# Patient Record
Sex: Female | Born: 2000
Health system: Southern US, Community
[De-identification: ages and names within clinical notes are randomized; demographics above are authoritative.]

## PROBLEM LIST (undated history)

## (undated) DIAGNOSIS — F909 Attention-deficit hyperactivity disorder, unspecified type: Secondary | ICD-10-CM

## (undated) DIAGNOSIS — G43009 Migraine without aura, not intractable, without status migrainosus: Secondary | ICD-10-CM

## (undated) HISTORY — DX: Migraine without aura, not intractable, without status migrainosus: G43.009

## (undated) HISTORY — DX: Attention-deficit hyperactivity disorder, unspecified type: F90.9

## (undated) HISTORY — PX: WISDOM TOOTH EXTRACTION: SHX21

## (undated) HISTORY — PX: MULTIPLE TOOTH EXTRACTIONS: SHX2053

---
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
## 2020-01-16 ENCOUNTER — Other Ambulatory Visit: Payer: Self-pay

## 2020-01-16 MED ORDER — NORETHIN ACE-ETH ESTRAD-FE 1-20 MG-MCG PO TABS: 1.0000 | ORAL_TABLET | Freq: Every day | ORAL | 0 refills | Status: DC

## 2020-01-16 NOTE — Telephone Encounter (Signed)
Script sent, please have her schedule an annual exam.

## 2020-01-16 NOTE — Telephone Encounter (Signed)
AEX scheduled for 02/26/2020 with JJ.

## 2020-01-16 NOTE — Telephone Encounter (Signed)
Medication refill request: OCP Last AEX: 02/21/2019 Next AEX: Not scheduled Last MMG (if hormonal medication request): n/a Refill authorized: 3 Package, 0 RF pended.   Please advise and refill if appropriate.

## 2020-01-20 ENCOUNTER — Ambulatory Visit: Payer: 59 | Attending: Internal Medicine

## 2020-01-20 DIAGNOSIS — Z23 Encounter for immunization: Secondary | ICD-10-CM

## 2020-01-20 NOTE — Progress Notes (Signed)
   Covid-19 Vaccination Clinic  Name:  Acquanetta Cabanilla    MRN: [id number] DOB: [birth year]  01/20/2020  Ms. Kaufhold was observed post Covid-19 immunization for 15 minutes without incident. She was provided with Vaccine Information Sheet and instruction to access the V-Safe system.   Ms. Cravey was instructed to call 911 with any severe reactions post vaccine: Marland Kitchen Difficulty breathing  . Swelling of face and throat  . A fast heartbeat  . A bad rash all over body  . Dizziness and weakness   Immunizations Administered    Name Date Dose VIS Date Route   Pfizer COVID-19 Vaccine 01/20/2020  4:05 PM 0.3 mL 11/19/2018 Intramuscular   Manufacturer: [employer], [employer]   Lot: [id number]   NDC: [address]-3

## 2020-02-24 NOTE — Progress Notes (Signed)
19 y.o. G0P0000 Single White or Caucasian Not Hispanic or Latino female here for annual exam.  Not sexually active since last year.  Period Cycle (Days): 28 Period Duration (Days): 7 Period Pattern: Regular Menstrual Flow: Heavy, Moderate Menstrual Control: Tampon Menstrual Control Change Freq (Hours): 1 Dysmenorrhea: (!) Moderate Dysmenorrhea Symptoms: Cramping, Headache(back pain)  Patient's last menstrual period was 02/13/2020 (approximate).          Sexually active: No.  The current method of family planning is OCP (estrogen/progesterone).    Exercising: Yes.    walking and occasional running Smoker:  Yes, e-cigarettes, trying to quit. Hasn't vaped in a week.   Health Maintenance: Pap:  N/A History of abnormal Pap:  N/A TDaP:  01/08/12 Gardasil: Complete    reports that she has been smoking e-cigarettes. She has never used smokeless tobacco. She reports that she does not drink alcohol or use drugs. Adopted by her PGM. 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   Past Medical History:  Diagnosis Date  . ADHD   . Migraine without aura     Past Surgical History:  Procedure Laterality Date  . MULTIPLE TOOTH EXTRACTIONS    . WISDOM TOOTH EXTRACTION      Current Outpatient Medications  Medication Sig Dispense Refill  . amitriptyline (ELAVIL) 25 MG tablet Take 1 tablet (25 mg total) by mouth at bedtime. 90 tablet 2  . Aspirin-Acetaminophen-Caffeine (PAMPRIN MAX PO) Take by mouth.    . Cetirizine HCl (ZYRTEC PO) Take by mouth.    . CONCERTA 54 MG CR tablet Take 1 tablet by mouth daily.    Marland Kitchen ibuprofen (ADVIL) 800 MG tablet Take 800 mg by mouth as needed.    . norethindrone-ethinyl estradiol (LOESTRIN FE) 1-20 MG-MCG tablet Take 1 tablet by mouth daily. 3 Package 0  . Vitamin D, Ergocalciferol, (DRISDOL) 50000 units CAPS capsule Take 50,000 Units by mouth every 7 (seven) days.    . Calcium-Magnesium-Vitamin D  (CALCIUM 500 PO) Take by mouth.    . Magnesium Oxide 500 MG TABS Take 1 tablet (500 mg total) by mouth daily. (Patient not taking: Reported on 05/21/2019)  0  . Multiple Vitamin (MULTIVITAMIN) capsule Take 1 capsule by mouth daily.     No current facility-administered medications for this visit.    Family History  Adopted: Yes  Problem Relation Age of Onset  . Breast cancer Maternal Aunt   . Breast cancer Maternal Grandmother   . Thyroid disease Paternal Grandmother   . Uterine cancer Paternal Grandmother   . Migraines Neg Hx   . Seizures Neg Hx   MGM was 27 with diagnosis, things her aunt was also in her 52's.   Review of Systems  Constitutional: Negative.   HENT: Negative.   Eyes: Negative.   Respiratory: Negative.   Cardiovascular: Negative.   Gastrointestinal: Negative.   Endocrine: Negative.   Genitourinary: Negative.   Musculoskeletal: Negative.   Skin: Negative.   Allergic/Immunologic: Negative.   Neurological: Negative.   Hematological: Negative.   Psychiatric/Behavioral: Negative.     Exam:   BP (!) 112/58 (BP Location: Right Arm, Patient Position: Sitting, Cuff Size: Normal)   Pulse (!) 110   Temp 98.7 F (37.1 C) (Temporal)   Ht 5' 1.75" (1.568 m)   Wt 99 lb 3.2 oz (45 kg)   LMP 02/13/2020 (Approximate)   SpO2 98%   BMI 18.29 kg/m   Weight change: @  WEIGHTCHANGE@ Height:   Height: 5' 1.75" (156.8 cm)  Ht Readings from Last 3 Encounters:  02/26/20 5' 1.75" (1.568 m) (16 %, Z= -0.99)*  02/21/19 5\' 2"  (1.575 m) (19 %, Z= -0.87)*  07/18/18 5' 1.5" (1.562 m) (15 %, Z= -1.05)*   * Growth percentiles are based on CDC (Girls, 2-20 Years) data.    General appearance: alert, cooperative and appears stated age Head: Normocephalic, without obvious abnormality, atraumatic Neck: no adenopathy, supple, symmetrical, trachea midline and thyroid normal to inspection and palpation Lungs: clear to auscultation bilaterally Cardiovascular: regular rate and  rhythm Breasts: normal appearance, no masses or tenderness Abdomen: soft, non-tender; non distended,  no masses,  no organomegaly Extremities: extremities normal, atraumatic, no cyanosis or edema Skin: Skin color, texture, turgor normal. No rashes or lesions Lymph nodes: Cervical, supraclavicular, and axillary nodes normal. No abnormal inguinal nodes palpated Neurologic: Grossly normal Pelvic: deferred  A:  Well Woman with normal exam  On OCP's, doing well  Not sexually active in the last year  FH of breast cancer  Vit d def  P:   No pap until 21  Continue OCP's  No STD testing needed  Discussed breast self exam  Discussed calcium and vit D intake  Screening labs, vit d

## 2020-02-25 ENCOUNTER — Other Ambulatory Visit: Payer: Self-pay

## 2020-02-26 ENCOUNTER — Ambulatory Visit: Payer: 59 | Admitting: Obstetrics and Gynecology

## 2020-02-26 ENCOUNTER — Encounter: Payer: Self-pay | Admitting: Obstetrics and Gynecology

## 2020-02-26 VITALS — BP 112/58 | HR 110 | Temp 98.7°F | Ht 61.75 in | Wt 99.2 lb

## 2020-02-26 DIAGNOSIS — E559 Vitamin D deficiency, unspecified: Secondary | ICD-10-CM

## 2020-02-26 DIAGNOSIS — Z3041 Encounter for surveillance of contraceptive pills: Secondary | ICD-10-CM | POA: Diagnosis not present

## 2020-02-26 DIAGNOSIS — Z0000 Encounter for general adult medical examination without abnormal findings: Secondary | ICD-10-CM

## 2020-02-26 DIAGNOSIS — Z01419 Encounter for gynecological examination (general) (routine) without abnormal findings: Secondary | ICD-10-CM | POA: Diagnosis not present

## 2020-02-26 MED ORDER — NORETHIN ACE-ETH ESTRAD-FE 1-20 MG-MCG PO TABS: 1.0000 | ORAL_TABLET | Freq: Every day | ORAL | 4 refills | Status: DC

## 2020-02-26 NOTE — Patient Instructions (Signed)
EXERCISE AND DIET:  We recommended that you start or continue a regular exercise program for good health. Regular exercise means any activity that makes your heart beat faster and makes you sweat.  We recommend exercising at least 30 minutes per day at least 3 days a week, preferably 4 or 5.  We also recommend a diet low in fat and sugar.  Inactivity, poor dietary choices and obesity can cause diabetes, heart attack, stroke, and kidney damage, among others.    ALCOHOL AND SMOKING:  Women should limit their alcohol intake to no more than 7 drinks/beers/glasses of wine (combined, not each!) per week. Moderation of alcohol intake to this level decreases your risk of breast cancer and liver damage. And of course, no recreational drugs are part of a healthy lifestyle.  And absolutely no smoking or even second hand smoke. Most people know smoking can cause heart and lung diseases, but did you know it also contributes to weakening of your bones? Aging of your skin?  Yellowing of your teeth and nails?  CALCIUM AND VITAMIN D:  Adequate intake of calcium and Vitamin D are recommended.  The recommendations for exact amounts of these supplements seem to change often, but generally speaking 1,000 mg of calcium (between diet and supplement) and 800 units of Vitamin D per day seems prudent. Certain women may benefit from higher intake of Vitamin D.  If you are among these women, your doctor will have told you during your visit.    PAP SMEARS:  Pap smears, to check for cervical cancer or precancers,  have traditionally been done yearly, although recent scientific advances have shown that most women can have pap smears less often.  However, every woman still should have a physical exam from her gynecologist every year. It will include a breast check, inspection of the vulva and vagina to check for abnormal growths or skin changes, a visual exam of the cervix, and then an exam to evaluate the size and shape of the uterus and  ovaries.  And after 19 years of age, a rectal exam is indicated to check for rectal cancers. We will also provide age appropriate advice regarding health maintenance, like when you should have certain vaccines, screening for sexually transmitted diseases, bone density testing, colonoscopy, mammograms, etc.   MAMMOGRAMS:  All women over 40 years old should have a yearly mammogram. Many facilities now offer a "3D" mammogram, which may cost around $50 extra out of pocket. If possible,  we recommend you accept the option to have the 3D mammogram performed.  It both reduces the number of women who will be called back for extra views which then turn out to be normal, and it is better than the routine mammogram at detecting truly abnormal areas.    COLON CANCER SCREENING: Now recommend starting at age 45. At this time colonoscopy is not covered for routine screening until 50. There are take home tests that can be done between 45-49.   COLONOSCOPY:  Colonoscopy to screen for colon cancer is recommended for all women at age 50.  We know, you hate the idea of the prep.  We agree, BUT, having colon cancer and not knowing it is worse!!  Colon cancer so often starts as a polyp that can be seen and removed at colonscopy, which can quite literally save your life!  And if your first colonoscopy is normal and you have no family history of colon cancer, most women don't have to have it again for   10 years.  Once every ten years, you can do something that may end up saving your life, right?  We will be happy to help you get it scheduled when you are ready.  Be sure to check your insurance coverage so you understand how much it will cost.  It may be covered as a preventative service at no cost, but you should check your particular policy.      Breast Self-Awareness Breast self-awareness means being familiar with how your breasts look and feel. It involves checking your breasts regularly and reporting any changes to your  health care provider. Practicing breast self-awareness is important. A change in your breasts can be a sign of a serious medical problem. Being familiar with how your breasts look and feel allows you to find any problems early, when treatment is more likely to be successful. All women should practice breast self-awareness, including women who have had breast implants. How to do a breast self-exam One way to learn what is normal for your breasts and whether your breasts are changing is to do a breast self-exam. To do a breast self-exam: Look for Changes  1. Remove all the clothing above your waist. 2. Stand in front of a mirror in a room with good lighting. 3. Put your hands on your hips. 4. Push your hands firmly downward. 5. Compare your breasts in the mirror. Look for differences between them (asymmetry), such as: ? Differences in shape. ? Differences in size. ? Puckers, dips, and bumps in one breast and not the other. 6. Look at each breast for changes in your skin, such as: ? Redness. ? Scaly areas. 7. Look for changes in your nipples, such as: ? Discharge. ? Bleeding. ? Dimpling. ? Redness. ? A change in position. Feel for Changes Carefully feel your breasts for lumps and changes. It is best to do this while lying on your back on the floor and again while sitting or standing in the shower or tub with soapy water on your skin. Feel each breast in the following way:  Place the arm on the side of the breast you are examining above your head.  Feel your breast with the other hand.  Start in the nipple area and make  inch (2 cm) overlapping circles to feel your breast. Use the pads of your three middle fingers to do this. Apply light pressure, then medium pressure, then firm pressure. The light pressure will allow you to feel the tissue closest to the skin. The medium pressure will allow you to feel the tissue that is a little deeper. The firm pressure will allow you to feel the tissue  close to the ribs.  Continue the overlapping circles, moving downward over the breast until you feel your ribs below your breast.  Move one finger-width toward the center of the body. Continue to use the  inch (2 cm) overlapping circles to feel your breast as you move slowly up toward your collarbone.  Continue the up and down exam using all three pressures until you reach your armpit.  Write Down What You Find  Write down what is normal for each breast and any changes that you find. Keep a written record with breast changes or normal findings for each breast. By writing this information down, you do not need to depend only on memory for size, tenderness, or location. Write down where you are in your menstrual cycle, if you are still menstruating. If you are having trouble noticing differences   in your breasts, do not get discouraged. With time you will become more familiar with the variations in your breasts and more comfortable with the exam. How often should I examine my breasts? Examine your breasts every month. If you are breastfeeding, the best time to examine your breasts is after a feeding or after using a breast pump. If you menstruate, the best time to examine your breasts is 5-7 days after your period is over. During your period, your breasts are lumpier, and it may be more difficult to notice changes. When should I see my health care provider? See your health care provider if you notice:  A change in shape or size of your breasts or nipples.  A change in the skin of your breast or nipples, such as a reddened or scaly area.  Unusual discharge from your nipples.  A lump or thick area that was not there before.  Pain in your breasts.  Anything that concerns you.  

## 2020-02-27 LAB — COMPREHENSIVE METABOLIC PANEL
ALT: 11 IU/L (ref 0–32)
AST: 14 IU/L (ref 0–40)
Albumin/Globulin Ratio: 2 (ref 1.2–2.2)
Albumin: 4.5 g/dL (ref 3.9–5.0)
Alkaline Phosphatase: 38 IU/L — ABNORMAL LOW (ref 45–106)
BUN/Creatinine Ratio: 16 (ref 9–23)
BUN: 13 mg/dL (ref 6–20)
Bilirubin Total: 0.3 mg/dL (ref 0.0–1.2)
CO2: 23 mmol/L (ref 20–29)
Calcium: 9.4 mg/dL (ref 8.7–10.2)
Chloride: 102 mmol/L (ref 96–106)
Creatinine, Ser: 0.79 mg/dL (ref 0.57–1.00)
GFR calc Af Amer: 125 mL/min/{1.73_m2} (ref >59)
GFR calc non Af Amer: 109 mL/min/{1.73_m2} (ref >59)
Globulin, Total: 2.3 g/dL (ref 1.5–4.5)
Glucose: 83 mg/dL (ref 65–99)
Potassium: 4.1 mmol/L (ref 3.5–5.2)
Sodium: 137 mmol/L (ref 134–144)
Total Protein: 6.8 g/dL (ref 6.0–8.5)

## 2020-02-27 LAB — VITAMIN D 25 HYDROXY (VIT D DEFICIENCY, FRACTURES): Vit D, 25-Hydroxy: 18.6 ng/mL — ABNORMAL LOW (ref 30.0–100.0)

## 2020-02-27 LAB — CBC
Hematocrit: 41.2 % (ref 34.0–46.6)
Hemoglobin: 13.8 g/dL (ref 11.1–15.9)
MCH: 28.7 pg (ref 26.6–33.0)
MCHC: 33.5 g/dL (ref 31.5–35.7)
MCV: 86 fL (ref 79–97)
Platelets: 225 10*3/uL (ref 150–450)
RBC: 4.81 x10E6/uL (ref 3.77–5.28)
RDW: 12.6 % (ref 11.7–15.4)
WBC: 4.1 10*3/uL (ref 3.4–10.8)

## 2020-02-27 LAB — LIPID PANEL
Chol/HDL Ratio: 3.8 ratio (ref 0.0–4.4)
Cholesterol, Total: 190 mg/dL — ABNORMAL HIGH (ref 100–169)
HDL: 50 mg/dL (ref >39)
LDL Chol Calc (NIH): 121 mg/dL — ABNORMAL HIGH (ref 0–109)
Triglycerides: 106 mg/dL — ABNORMAL HIGH (ref 0–89)
VLDL Cholesterol Cal: 19 mg/dL (ref 5–40)

## 2020-10-15 ENCOUNTER — Telehealth (INDEPENDENT_AMBULATORY_CARE_PROVIDER_SITE_OTHER): Payer: Self-pay | Admitting: Neurology

## 2020-10-15 NOTE — Telephone Encounter (Signed)
LVM for patient to return call to schedule follow up.

## 2021-01-27 ENCOUNTER — Encounter (INDEPENDENT_AMBULATORY_CARE_PROVIDER_SITE_OTHER): Payer: Self-pay

## 2021-02-15 ENCOUNTER — Encounter: Payer: Self-pay | Admitting: Obstetrics and Gynecology

## 2021-02-15 ENCOUNTER — Ambulatory Visit: Payer: 59 | Admitting: Obstetrics and Gynecology

## 2021-02-15 ENCOUNTER — Other Ambulatory Visit: Payer: Self-pay

## 2021-02-15 VITALS — BP 100/72 | HR 84 | Ht 62.0 in | Wt 110.2 lb

## 2021-02-15 DIAGNOSIS — Z3041 Encounter for surveillance of contraceptive pills: Secondary | ICD-10-CM

## 2021-02-15 MED ORDER — NORETHIN ACE-ETH ESTRAD-FE 1-20 MG-MCG PO TABS: 1.0000 | ORAL_TABLET | Freq: Every day | ORAL | 0 refills | Status: DC

## 2021-02-15 NOTE — Progress Notes (Signed)
GYNECOLOGY  VISIT   HPI: 20 y.o.   Single White or Caucasian Not Hispanic or Latino  female   G0P0000 with Patient's last menstrual period was 02/11/2021 (approximate).   here for birth control consult. Patient is sexually active patient has not had sex since she ran out of birth control on Friday. Menses q month x 4-5 days. She can saturate a super + tampon 4 x in a day. Occasional cramps.  Sexually active, same partner x 5 months. Using condoms. Partner hasn't ever had another partner, just had negative STD testing.    GYNECOLOGIC HISTORY: Patient's last menstrual period was 02/11/2021 (approximate). Contraception:ocps Menopausal hormone therapy: none         OB History    Gravida  0   Para      Term      Preterm      AB  0   Living        SAB  0   IAB      Ectopic      Multiple      Live Births                 Patient Active Problem List   Diagnosis Date Noted  . Migraine without aura and without status migrainosus, not intractable 05/03/2018  . Abdominal migraine, not intractable 05/03/2018  . Anxiety state 05/03/2018  . Attention deficit hyperactivity disorder (ADHD), combined type 05/03/2018    Past Medical History:  Diagnosis Date  . ADHD   . Migraine without aura     Past Surgical History:  Procedure Laterality Date  . MULTIPLE TOOTH EXTRACTIONS    . WISDOM TOOTH EXTRACTION      Current Outpatient Medications  Medication Sig Dispense Refill  . amitriptyline (ELAVIL) 25 MG tablet Take 1 tablet (25 mg total) by mouth at bedtime. 90 tablet 2  . Aspirin-Acetaminophen-Caffeine (PAMPRIN MAX PO) Take by mouth.    . CONCERTA 54 MG CR tablet Take 1 tablet by mouth daily.    Marland Kitchen ibuprofen (ADVIL) 800 MG tablet Take 800 mg by mouth as needed.    . norethindrone-ethinyl estradiol (LOESTRIN FE) 1-20 MG-MCG tablet Take 1 tablet by mouth daily. 3 Package 4  . Vitamin D, Ergocalciferol, (DRISDOL) 50000 units CAPS capsule Take 50,000 Units by mouth every 7  (seven) days.     No current facility-administered medications for this visit.     ALLERGIES: Patient has no known allergies.  Family History  Adopted: Yes  Problem Relation Age of Onset  . Breast cancer Maternal Aunt   . Breast cancer Maternal Grandmother   . Thyroid disease Paternal Grandmother   . Uterine cancer Paternal Grandmother   . Migraines Neg Hx   . Seizures Neg Hx     Social History   Socioeconomic History  . Marital status: Single    Spouse name: Not on file  . Number of children: Not on file  . Years of education: Not on file  . Highest education level: Not on file  Occupational History  . Not on file  Tobacco Use  . Smoking status: Former Smoker    Types: E-cigarettes  . Smokeless tobacco: Never Used  Vaping Use  . Vaping Use: Every day  Substance and Sexual Activity  . Alcohol use: No  . Drug use: No  . Sexual activity: Not Currently    Partners: Male    Birth control/protection: Pill  Other Topics Concern  . Not on file  Social  History Narrative   Lives with adoptive mom and dad. She is in the 12th grade at [employer]. She enjoys horseback riding, singing, and dancing   Social Determinants of Health   Financial Resource Strain: Not on file  Food Insecurity: Not on file  Transportation Needs: Not on file  Physical Activity: Not on file  Stress: Not on file  Social Connections: Not on file  Intimate Partner Violence: Not on file    Review of Systems  All other systems reviewed and are negative.   PHYSICAL EXAMINATION:    BP 100/72   Pulse 84   Ht 5\' 2"  (1.575 m)   Wt 110 lb 3.2 oz (50 kg)   LMP 02/11/2021 (Approximate)   SpO2 99%   BMI 20.16 kg/m     General appearance: alert, cooperative and appears stated age  7. Encounter for surveillance of contraceptive pills She ran out of pills - norethindrone-ethinyl estradiol-FE (LOESTRIN FE) 1-20 MG-MCG tablet; Take 1 tablet by mouth daily.  Dispense: 84 tablet; Refill:  0 -Will schedule an annual exam in 3 months -Declines STD testing -Continued use of condoms encouraged. She will restart her pills today and continue back up contraception for one week.

## 2021-02-15 NOTE — Addendum Note (Signed)
Addended by: Blima Ledger on: 02/15/2021 04:02 PM   Modules accepted: Orders

## 2021-04-12 ENCOUNTER — Other Ambulatory Visit: Payer: Self-pay

## 2021-04-12 DIAGNOSIS — Z3041 Encounter for surveillance of contraceptive pills: Secondary | ICD-10-CM

## 2021-04-12 MED ORDER — NORETHIN ACE-ETH ESTRAD-FE 1-20 MG-MCG PO TABS: 1.0000 | ORAL_TABLET | Freq: Every day | ORAL | 0 refills | Status: DC

## 2021-04-12 NOTE — Telephone Encounter (Signed)
Last AEX 02/26/2020. Needs OC refill.  She has trouble scheduling appointment for AEX because of her job schedule and waiting on her schedule. I encouraged her to go ahead and schedule her AEX to have a time held and hopefully her manager can work with her on it. She is scheduled for AEX 06/10/21.

## 2021-06-08 NOTE — Progress Notes (Signed)
20 y.o. G0P0000 Single White or Caucasian Not Hispanic or Latino female here for annual exam.  She states that she has been having a period about every 2 weeks.  She is taking her pills consistently. She has a monthly cycle for 3-4 days, light. Can spot for 2 days in between. Cramps are better overall, occasionally bad (better than before pills). Doesn't need NSAID's.  Sexually active, same partner x 9 months. Using condoms.  Period Duration (Days): 2-3 Period Pattern: (!) Irregular Menstrual Flow: Light Menstrual Control: Tampon, Panty liner Menstrual Control Change Freq (Hours): 4-6 Dysmenorrhea: (!) Severe Dysmenorrhea Symptoms: Cramping  Patient's last menstrual period was 05/30/2021.          Sexually active: Yes.    The current method of family planning is OCP (estrogen/progesterone).    Exercising: Yes.    The patient has a physically strenuous job, but has no regular exercise apart from work.  Smoker: trying to quit.   Health Maintenance: Pap:  n/a History of abnormal Pap:  na MMG:  never  BMD:   never  Colonoscopy: never  TDaP:  01/08/12  Gardasil: complete    reports that she has quit smoking. Her smoking use included e-cigarettes. She has never used smokeless tobacco. She reports that she does not drink alcohol and does not use drugs. 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 Living at home still.   Past Medical History:  Diagnosis Date   ADHD    Migraine without aura     Past Surgical History:  Procedure Laterality Date   MULTIPLE TOOTH EXTRACTIONS     WISDOM TOOTH EXTRACTION      Current Outpatient Medications  Medication Sig Dispense Refill   amitriptyline (ELAVIL) 25 MG tablet Take 1 tablet (25 mg total) by mouth at bedtime. 90 tablet 2   Aspirin-Acetaminophen-Caffeine (PAMPRIN MAX PO) Take by mouth.     ibuprofen (ADVIL) 800 MG tablet Take 800 mg by mouth as needed.     methylphenidate (CONCERTA) 36  MG PO CR tablet 1 tablet in the morning     norethindrone-ethinyl estradiol-FE (LOESTRIN FE) 1-20 MG-MCG tablet Take 1 tablet by mouth daily. 84 tablet 0   Vitamin D, Ergocalciferol, (DRISDOL) 50000 units CAPS capsule Take 50,000 Units by mouth every 7 (seven) days.     No current facility-administered medications for this visit.    Family History  Adopted: Yes  Problem Relation Age of Onset   Breast cancer Maternal Aunt    Breast cancer Maternal Grandmother    Thyroid disease Paternal Grandmother    Uterine cancer Paternal Grandmother    Migraines Neg Hx    Seizures Neg Hx     Review of Systems  Genitourinary:  Positive for vaginal bleeding.  All other systems reviewed and are negative.  Exam:   BP 110/70   Pulse 88   Ht 5\' 2"  (1.575 m)   Wt 103 lb (46.7 kg)   LMP 05/30/2021   SpO2 100%   BMI 18.84 kg/m   Weight change: @WEIGHTCHANGE @ Height:   Height: 5\' 2"  (157.5 cm)  Ht Readings from Last 3 Encounters:  06/10/21 5\' 2"  (1.575 m)  02/15/21 5\' 2"  (1.575 m)  02/26/20 5' 1.75" (1.568 m) (16 %, Z= -0.99)*   * Growth percentiles are based on CDC (Girls, 2-20 Years) data.    General appearance: alert, cooperative and appears stated age Head: Normocephalic, without obvious  abnormality, atraumatic Neck: no adenopathy, supple, symmetrical, trachea midline and thyroid normal to inspection and palpation Lungs: clear to auscultation bilaterally Cardiovascular: regular rate and rhythm Breasts: normal appearance, no masses or tenderness Abdomen: soft, non-tender; non distended,  no masses,  no organomegaly Extremities: extremities normal, atraumatic, no cyanosis or edema Skin: Skin color, texture, turgor normal. No rashes or lesions Lymph nodes: Cervical, supraclavicular, and axillary nodes normal. No abnormal inguinal nodes palpated Neurologic: Grossly normal Pelvic: deferred  1. Well woman exam Discussed breast self exam Discussed calcium and vit D intake Pap next  year  2. Encounter for surveillance of contraceptive pills Doing well - norethindrone-ethinyl estradiol-FE (LOESTRIN FE) 1-20 MG-MCG tablet; Take 1 tablet by mouth daily.  Dispense: 84 tablet; Refill: 3 -Continue to use condoms  3. Family history of breast cancer  4. Vitamin D deficiency Getting vit d in her diet She will have lab work with her primary  5. Screening examination for STD (sexually transmitted disease) Declines blood work - SURESWAB CT/NG/T. vaginalis

## 2021-06-10 ENCOUNTER — Ambulatory Visit (INDEPENDENT_AMBULATORY_CARE_PROVIDER_SITE_OTHER): Payer: 59 | Admitting: Obstetrics and Gynecology

## 2021-06-10 ENCOUNTER — Other Ambulatory Visit: Payer: Self-pay

## 2021-06-10 VITALS — BP 110/70 | HR 88 | Ht 62.0 in | Wt 103.0 lb

## 2021-06-10 DIAGNOSIS — Z01419 Encounter for gynecological examination (general) (routine) without abnormal findings: Secondary | ICD-10-CM | POA: Diagnosis not present

## 2021-06-10 DIAGNOSIS — Z3041 Encounter for surveillance of contraceptive pills: Secondary | ICD-10-CM | POA: Diagnosis not present

## 2021-06-10 DIAGNOSIS — Z803 Family history of malignant neoplasm of breast: Secondary | ICD-10-CM | POA: Diagnosis not present

## 2021-06-10 DIAGNOSIS — E559 Vitamin D deficiency, unspecified: Secondary | ICD-10-CM | POA: Diagnosis not present

## 2021-06-10 DIAGNOSIS — Z113 Encounter for screening for infections with a predominantly sexual mode of transmission: Secondary | ICD-10-CM

## 2021-06-10 DIAGNOSIS — G43909 Migraine, unspecified, not intractable, without status migrainosus: Secondary | ICD-10-CM | POA: Insufficient documentation

## 2021-06-10 MED ORDER — NORETHIN ACE-ETH ESTRAD-FE 1-20 MG-MCG PO TABS: 1.0000 | ORAL_TABLET | Freq: Every day | ORAL | 3 refills | Status: DC

## 2021-06-13 LAB — SURESWAB CT/NG/T. VAGINALIS
C. trachomatis RNA, TMA: NOT DETECTED
N. gonorrhoeae RNA, TMA: NOT DETECTED
Trichomonas vaginalis RNA: NOT DETECTED

## 2021-07-07 ENCOUNTER — Telehealth: Payer: Self-pay

## 2021-07-07 NOTE — Telephone Encounter (Signed)
Patient was having trouble getting refill on her bcp. I called her pharmacy and they confirmed Rx received 06/16/21 and they will get her refill ready. Patient informed per pharmacy that her ins will only cover one pack at a time.

## 2021-07-27 ENCOUNTER — Other Ambulatory Visit: Payer: Self-pay

## 2021-07-27 ENCOUNTER — Emergency Department (HOSPITAL_COMMUNITY)
Admission: EM | Admit: 2021-07-27 | Discharge: 2021-07-27 | Disposition: A | Discharge status: Home / Self Care | Payer: 59 | Attending: Emergency Medicine | Admitting: Emergency Medicine

## 2021-07-27 ENCOUNTER — Encounter (HOSPITAL_COMMUNITY): Payer: Self-pay | Admitting: Emergency Medicine

## 2021-07-27 DIAGNOSIS — Z7982 Long term (current) use of aspirin: Secondary | ICD-10-CM | POA: Diagnosis not present

## 2021-07-27 DIAGNOSIS — Z87891 Personal history of nicotine dependence: Secondary | ICD-10-CM | POA: Diagnosis not present

## 2021-07-27 DIAGNOSIS — Z20822 Contact with and (suspected) exposure to covid-19: Secondary | ICD-10-CM | POA: Insufficient documentation

## 2021-07-27 DIAGNOSIS — J101 Influenza due to other identified influenza virus with other respiratory manifestations: Secondary | ICD-10-CM

## 2021-07-27 DIAGNOSIS — R5383 Other fatigue: Secondary | ICD-10-CM | POA: Insufficient documentation

## 2021-07-27 DIAGNOSIS — R1110 Vomiting, unspecified: Secondary | ICD-10-CM | POA: Diagnosis present

## 2021-07-27 LAB — CBC WITH DIFFERENTIAL/PLATELET
Abs Immature Granulocytes: 0.03 10*3/uL (ref 0.00–0.07)
Basophils Absolute: 0 10*3/uL (ref 0.0–0.1)
Basophils Relative: 0 %
Eosinophils Absolute: 0 10*3/uL (ref 0.0–0.5)
Eosinophils Relative: 0 %
HCT: 43 % (ref 36.0–46.0)
Hemoglobin: 15.2 g/dL — ABNORMAL HIGH (ref 12.0–15.0)
Immature Granulocytes: 0 %
Lymphocytes Relative: 10 %
Lymphs Abs: 0.8 10*3/uL (ref 0.7–4.0)
MCH: 29 pg (ref 26.0–34.0)
MCHC: 35.3 g/dL (ref 30.0–36.0)
MCV: 81.9 fL (ref 80.0–100.0)
Monocytes Absolute: 0.7 10*3/uL (ref 0.1–1.0)
Monocytes Relative: 10 %
Neutro Abs: 6 10*3/uL (ref 1.7–7.7)
Neutrophils Relative %: 80 %
Platelets: 277 10*3/uL (ref 150–400)
RBC: 5.25 MIL/uL — ABNORMAL HIGH (ref 3.87–5.11)
RDW: 13.5 % (ref 11.5–15.5)
WBC: 7.6 10*3/uL (ref 4.0–10.5)
nRBC: 0 % (ref 0.0–0.2)

## 2021-07-27 LAB — COMPREHENSIVE METABOLIC PANEL WITH GFR
ALT: 23 U/L (ref 0–44)
AST: 26 U/L (ref 15–41)
Albumin: 4.6 g/dL (ref 3.5–5.0)
Alkaline Phosphatase: 31 U/L — ABNORMAL LOW (ref 38–126)
Anion gap: 14 (ref 5–15)
BUN: 15 mg/dL (ref 6–20)
CO2: 17 mmol/L — ABNORMAL LOW (ref 22–32)
Calcium: 9.6 mg/dL (ref 8.9–10.3)
Chloride: 104 mmol/L (ref 98–111)
Creatinine, Ser: 0.91 mg/dL (ref 0.44–1.00)
GFR, Estimated: >60 mL/min
Glucose, Bld: 137 mg/dL — ABNORMAL HIGH (ref 70–99)
Potassium: 3.9 mmol/L (ref 3.5–5.1)
Sodium: 135 mmol/L (ref 135–145)
Total Bilirubin: 0.8 mg/dL (ref 0.3–1.2)
Total Protein: 7.6 g/dL (ref 6.5–8.1)

## 2021-07-27 LAB — URINALYSIS, ROUTINE W REFLEX MICROSCOPIC
Bilirubin Urine: NEGATIVE
Glucose, UA: NEGATIVE mg/dL
Ketones, ur: 80 mg/dL — AB
Nitrite: NEGATIVE
Protein, ur: 100 mg/dL — AB
Specific Gravity, Urine: 1.034 — ABNORMAL HIGH (ref 1.005–1.030)
pH: 5 (ref 5.0–8.0)

## 2021-07-27 LAB — RESP PANEL BY RT-PCR (FLU A&B, COVID) ARPGX2
Influenza A by PCR: POSITIVE — AB
Influenza B by PCR: NEGATIVE
SARS Coronavirus 2 by RT PCR: NEGATIVE

## 2021-07-27 LAB — PREGNANCY, URINE: Preg Test, Ur: NEGATIVE

## 2021-07-27 MED ORDER — SODIUM CHLORIDE 0.9 % IV BOLUS: 1000.0000 mL | Freq: Once | INTRAVENOUS | Status: DC

## 2021-07-27 MED ORDER — ONDANSETRON 4 MG PO TBDP: 4.0000 mg | ORAL_TABLET | Freq: Three times a day (TID) | ORAL | 0 refills | Status: DC | PRN

## 2021-07-27 MED ORDER — ONDANSETRON 4 MG PO TBDP
4.0000 mg | ORAL_TABLET | Freq: Once | ORAL | Status: AC
  Administered 2021-07-27: 4 mg via ORAL
  Filled 2021-07-27: qty 1

## 2021-07-27 NOTE — ED Triage Notes (Signed)
Pt c/o n/v/d and abdominal pain since Monday.  Denies fever at this time.

## 2021-07-27 NOTE — Discharge Instructions (Signed)

## 2021-07-27 NOTE — ED Provider Notes (Signed)
Emergency Medicine Provider Triage Evaluation Note  Victoria Conway , a 20 y.o. female  was evaluated in triage.  Pt complains of n/v/d and abdominal pain.  States that the symptoms started yesterday.  She denies any fever.  Denies ever having felt like this.  Denies urinary or vaginal complaints.  Review of Systems  Positive: N/v/d, abdominal pain Negative: Fever, chills  Physical Exam  BP 118/82 (BP Location: Left Arm)   Pulse 98   Temp 97.6 F (36.4 C)   Resp 17   SpO2 99%  Gen:   Awake, no distress   Resp:  Normal effort  MSK:   Moves extremities without difficulty  Other:  Generalized abdominal discomfort, No focal abdominal tenderness, no RLQ tenderness or pain at McBurney's point, no RUQ tenderness or Murphy's sign, no left-sided abdominal tenderness, no fluid wave, or signs of peritonitis   Medical Decision Making  Medically screening exam initiated at 5:47 AM.  Appropriate orders placed.  Lorre Opdahl was informed that the remainder of the evaluation will be completed by another provider, this initial triage assessment does not replace that evaluation, and the importance of remaining in the ED until their evaluation is complete.     Roxy Horseman, PA-C 07/27/21 [id number]    Nicanor Alcon, April, MD 07/27/21 [phone]

## 2021-07-27 NOTE — ED Provider Notes (Signed)
[facility] EMERGENCY DEPARTMENT Provider Note   CSN: [id number] Arrival date & time: 07/27/21  0530     History Chief Complaint  Patient presents with   Emesis   Diarrhea    Victoria Conway is a 20 y.o. female.   Influenza Presenting symptoms: cough, diarrhea, fatigue, headache, myalgias, nausea and vomiting   Vomiting:    Quality:  Stomach contents   Severity:  Moderate   Duration:  2 days   Progression:  Worsening Severity:  Moderate Chronicity:  New Relieved by:  Nothing Worsened by:  Nothing Ineffective treatments:  OTC medications Associated symptoms: chills, decreased appetite and decreased physical activity   Associated symptoms: no ear pain, no mental status change, no congestion, no neck stiffness and no syncope       Past Medical History:  Diagnosis Date   ADHD    Migraine without aura     Patient Active Problem List   Diagnosis Date Noted   Migraine 06/10/2021   Migraine without aura and without status migrainosus, not intractable 05/03/2018   Abdominal migraine, not intractable 05/03/2018   Anxiety state 05/03/2018   Attention deficit hyperactivity disorder (ADHD), combined type 05/03/2018    Past Surgical History:  Procedure Laterality Date   MULTIPLE TOOTH EXTRACTIONS     WISDOM TOOTH EXTRACTION       OB History     Gravida  0   Para      Term      Preterm      AB  0   Living         SAB  0   IAB      Ectopic      Multiple      Live Births              Family History  Adopted: Yes  Problem Relation Age of Onset   Breast cancer Maternal Aunt    Breast cancer Maternal Grandmother    Thyroid disease Paternal Grandmother    Uterine cancer Paternal Grandmother    Migraines Neg Hx    Seizures Neg Hx     Social History   Tobacco Use   Smoking status: Former    Types: E-cigarettes   Smokeless tobacco: Never  Vaping Use   Vaping Use: Every day  Substance Use Topics   Alcohol use: No   Drug  use: No    Home Medications Prior to Admission medications   Medication Sig Start Date End Date Taking? Authorizing Provider  amitriptyline (ELAVIL) 25 MG tablet Take 1 tablet (25 mg total) by mouth at bedtime. 05/21/19   Keturah Shavers, MD  Aspirin-Acetaminophen-Caffeine (PAMPRIN MAX PO) Take by mouth.    [provider]  ibuprofen (ADVIL) 800 MG tablet Take 800 mg by mouth as needed.    [provider]  methylphenidate (CONCERTA) 36 MG PO CR tablet 1 tablet in the morning    [provider]  norethindrone-ethinyl estradiol-FE (LOESTRIN FE) 1-20 MG-MCG tablet Take 1 tablet by mouth daily. 06/10/21   Romualdo Bolk, MD  Vitamin D, Ergocalciferol, (DRISDOL) 50000 units CAPS capsule Take 50,000 Units by mouth every 7 (seven) days.    [provider]    Allergies    Patient has no known allergies.  Review of Systems   Review of Systems  Constitutional:  Positive for chills, decreased appetite and fatigue.  HENT:  Negative for congestion and ear pain.   Respiratory:  Positive for cough.  Gastrointestinal:  Positive for diarrhea, nausea and vomiting.  Musculoskeletal:  Positive for myalgias. Negative for neck stiffness.  Neurological:  Positive for headaches.   Physical Exam Updated Vital Signs BP 116/86 (BP Location: Right Arm)   Pulse 89   Temp (!) 97.5 F (36.4 C) (Oral)   Resp 20   SpO2 97%   Physical Exam Vitals and nursing note reviewed.  Constitutional:      General: She is not in acute distress.    Appearance: She is well-developed. She is not diaphoretic.  HENT:     Head: Normocephalic and atraumatic.     Right Ear: External ear normal.     Left Ear: External ear normal.     Nose: Nose normal.     Mouth/Throat:     Mouth: Mucous membranes are moist.  Eyes:     General: No scleral icterus.    Conjunctiva/sclera: Conjunctivae normal.  Cardiovascular:     Rate and Rhythm: Normal rate and regular rhythm.     Heart sounds:  Normal heart sounds. No murmur heard.   No friction rub. No gallop.  Pulmonary:     Effort: Pulmonary effort is normal. No respiratory distress.     Breath sounds: Normal breath sounds.  Abdominal:     General: Bowel sounds are normal. There is no distension.     Palpations: Abdomen is soft. There is no mass.     Tenderness: There is no abdominal tenderness. There is no guarding.  Musculoskeletal:     Cervical back: Normal range of motion.  Skin:    General: Skin is warm and dry.  Neurological:     Mental Status: She is alert and oriented to person, place, and time.  Psychiatric:        Behavior: Behavior normal.    ED Results / Procedures / Treatments   Labs (all labs ordered are listed, but only abnormal results are displayed) Labs Reviewed  RESP PANEL BY RT-PCR (FLU A&B, COVID) ARPGX2 - Abnormal; Notable for the following components:      Result Value   Influenza A by PCR POSITIVE (*)    All other components within normal limits  CBC WITH DIFFERENTIAL/PLATELET - Abnormal; Notable for the following components:   RBC 5.25 (*)    Hemoglobin 15.2 (*)    All other components within normal limits  COMPREHENSIVE METABOLIC PANEL - Abnormal; Notable for the following components:   CO2 17 (*)    Glucose, Bld 137 (*)    Alkaline Phosphatase 31 (*)    All other components within normal limits  URINALYSIS, ROUTINE W REFLEX MICROSCOPIC  PREGNANCY, URINE    EKG None  Radiology No results found.  Procedures Procedures   Medications Ordered in ED Medications  sodium chloride 0.9 % bolus 1,000 mL (has no administration in time range)  ondansetron (ZOFRAN-ODT) disintegrating tablet 4 mg (4 mg Oral Given 07/27/21 0550)    ED Course  I have reviewed the triage vital signs and the nursing notes.  Pertinent labs & imaging results that were available during my care of the patient were reviewed by me and considered in my medical decision making (see chart for details).    MDM  Rules/Calculators/A&P                            Patient with symptoms consistent with influenza.  Vitals are stable,   No signs of dehydration, tolerating PO's.  Lungs are  clear. Due to patient's presentation and physical exam a chest x-ray was not ordered  Patient will be discharged with Zofran and instructions to orally hydrate, rest, and use over-the-counter medications such as anti-inflammatories ibuprofen and Aleve for muscle aches and Tylenol for fever.  Patient will also be given a cough suppressant.   Final Clinical Impression(s) / ED Diagnoses Final diagnoses:  Influenza A    Rx / DC Orders ED Discharge Orders     None        Arthor Captain, PA-C 07/27/21 1217    Cheryll Cockayne, MD 08/06/21 [phone]

## 2022-02-11 ENCOUNTER — Emergency Department (HOSPITAL_BASED_OUTPATIENT_CLINIC_OR_DEPARTMENT_OTHER)
Admission: EM | Admit: 2022-02-11 | Discharge: 2022-02-12 | Disposition: A | Discharge status: Home / Self Care | Payer: 59 | Attending: Emergency Medicine | Admitting: Emergency Medicine

## 2022-02-11 ENCOUNTER — Other Ambulatory Visit: Payer: Self-pay

## 2022-02-11 ENCOUNTER — Encounter (HOSPITAL_BASED_OUTPATIENT_CLINIC_OR_DEPARTMENT_OTHER): Payer: Self-pay

## 2022-02-11 ENCOUNTER — Emergency Department (HOSPITAL_BASED_OUTPATIENT_CLINIC_OR_DEPARTMENT_OTHER): Payer: 59

## 2022-02-11 DIAGNOSIS — S0990XA Unspecified injury of head, initial encounter: Secondary | ICD-10-CM | POA: Diagnosis present

## 2022-02-11 DIAGNOSIS — S060X0A Concussion without loss of consciousness, initial encounter: Secondary | ICD-10-CM | POA: Insufficient documentation

## 2022-02-11 DIAGNOSIS — Y92410 Unspecified street and highway as the place of occurrence of the external cause: Secondary | ICD-10-CM | POA: Insufficient documentation

## 2022-02-11 DIAGNOSIS — Z7982 Long term (current) use of aspirin: Secondary | ICD-10-CM | POA: Insufficient documentation

## 2022-02-11 MED ORDER — ONDANSETRON 4 MG PO TBDP
4.0000 mg | ORAL_TABLET | Freq: Once | ORAL | Status: AC | PRN
  Administered 2022-02-11: 4 mg via ORAL
  Filled 2022-02-11: qty 1
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
## 2022-02-12 MED ORDER — PROCHLORPERAZINE EDISYLATE 10 MG/2ML IJ SOLN
10.0000 mg | Freq: Once | INTRAMUSCULAR | Status: AC
  Administered 2022-02-12: 10 mg via INTRAVENOUS
  Filled 2022-02-12: qty 2

## 2022-02-12 MED ORDER — SODIUM CHLORIDE 0.9 % IV BOLUS
1000.0000 mL | Freq: Once | INTRAVENOUS | Status: AC
  Administered 2022-02-12: 1000 mL via INTRAVENOUS

## 2022-02-12 MED ORDER — DIPHENHYDRAMINE HCL 50 MG/ML IJ SOLN
25.0000 mg | Freq: Once | INTRAMUSCULAR | Status: AC
  Administered 2022-02-12: 25 mg via INTRAVENOUS
  Filled 2022-02-12: qty 1

## 2022-02-12 MED ORDER — KETOROLAC TROMETHAMINE 15 MG/ML IJ SOLN
15.0000 mg | Freq: Once | INTRAMUSCULAR | Status: AC
  Administered 2022-02-12: 15 mg via INTRAVENOUS
  Filled 2022-02-12: qty 1

## 2022-02-12 NOTE — ED Notes (Addendum)
Late entry -- This nurse initially entered room to find pt sitting at foot of bed -- reporting nausea and reports having vomiting episode -- Pt noted to no longer be wearing C-Collar applied by ED tech Amy (per tech C-Collar applied prior to pt departure to CT) -- patient confirms C-collar cleared by ED attending.  Med orders pending to address head pain and nausea (see MAR for med administration).  Pt now lying quietly awake in bed -  1L NS bolus infusing without complication to 20G R AC; dressing dry and intact.  Mother at bedside.
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
## 2022-02-12 NOTE — ED Notes (Signed)
Pt now resting with eyes closed on R lateral side; RR even and unlabored on RA.  1L NS bolus continues to infuse without complication.  Mother at bedside.

## 2022-06-19 NOTE — Progress Notes (Signed)
21 y.o. G0P0000 Single White or Caucasian Not Hispanic or Latino female here for annual exam.   She states that she would like to go back on ocps. She states that her period is heavier and her cramping is worse with out them. She ran out of them. Sexually active, same partner x 2 years. Uses female condoms.  Period Cycle (Days): 28 Period Duration (Days): 7 Period Pattern: Regular Menstrual Flow: Moderate Menstrual Control: Tampon Menstrual Control Change Freq (Hours): 2 Dysmenorrhea: (!) Moderate Dysmenorrhea Symptoms: Cramping  Patient's last menstrual period was 05/29/2022 (approximate).          Sexually active: Yes.    The current method of family planning is none.   Female partner  Exercising: No.  The patient does not participate in regular exercise at present. Smoker:  Vaping    Health Maintenance: Pap:  n/a  History of abnormal Pap:  n/a MMG:  n/a BMD:   n/a Colonoscopy: n/a TDaP:  01/08/12 Gardasil: complete    reports that she has quit smoking. Her smoking use included e-cigarettes. She has never used smokeless tobacco. She reports current alcohol use. She reports current drug use. Frequency: 7.00 times per week. Drug: Marijuana.  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   Past Medical History:  Diagnosis Date   ADHD    Migraine without aura     Past Surgical History:  Procedure Laterality Date   MULTIPLE TOOTH EXTRACTIONS     WISDOM TOOTH EXTRACTION      Current Outpatient Medications  Medication Sig Dispense Refill   Aspirin-Acetaminophen-Caffeine (PAMPRIN MAX PO) Take by mouth.     buPROPion (WELLBUTRIN XL) 150 MG 24 hr tablet Take 150 mg by mouth every morning.     ibuprofen (ADVIL) 800 MG tablet Take 800 mg by mouth as needed.     ondansetron (ZOFRAN ODT) 4 MG disintegrating tablet Take 1 tablet (4 mg total) by mouth every 8 (eight) hours as needed for nausea or vomiting. 20 tablet 0   amitriptyline (ELAVIL) 25 MG tablet Take  1 tablet (25 mg total) by mouth at bedtime. (Patient not taking: Reported on 06/26/2022) 90 tablet 2   norethindrone-ethinyl estradiol-FE (LOESTRIN FE) 1-20 MG-MCG tablet Take 1 tablet by mouth daily. (Patient not taking: Reported on 06/26/2022) 84 tablet 3   No current facility-administered medications for this visit.    Family History  Adopted: Yes  Problem Relation Age of Onset   Breast cancer Maternal Aunt    Breast cancer Maternal Grandmother    Thyroid disease Paternal Grandmother    Uterine cancer Paternal Grandmother    Migraines Neg Hx    Seizures Neg Hx     Review of Systems  All other systems reviewed and are negative.   Exam:   BP 122/64   Pulse 98   Ht 5' 1.25" (1.556 m)   Wt 103 lb (46.7 kg)   LMP 05/29/2022 (Approximate)   SpO2 99%   BMI 19.30 kg/m   Weight change: @WEIGHTCHANGE @ Height:   Height: 5' 1.25" (155.6 cm)  Ht Readings from Last 3 Encounters:  06/26/22 5' 1.25" (1.556 m)  02/11/22 5\' 2"  (1.575 m)  06/10/21 5\' 2"  (1.575 m)    General appearance: alert, cooperative and appears stated age Head: Normocephalic, without obvious abnormality, atraumatic Neck: no adenopathy, supple, symmetrical, trachea midline and thyroid normal to inspection and palpation Lungs: clear to auscultation bilaterally Cardiovascular: regular rate and rhythm Breasts:  normal appearance, no masses or tenderness Abdomen: soft, non-tender; non distended,  no masses,  no organomegaly Extremities: extremities normal, atraumatic, no cyanosis or edema Skin: Skin color, texture, turgor normal. No rashes or lesions Lymph nodes: Cervical, supraclavicular, and axillary nodes normal. No abnormal inguinal nodes palpated Neurologic: Grossly normal   Pelvic: External genitalia:  no lesions              Urethra:  normal appearing urethra with no masses, tenderness or lesions              Bartholins and Skenes: normal                 Vagina: normal appearing vagina with normal color and  discharge, no lesions              Cervix: no lesions               Bimanual Exam:  Uterus:  normal size, contour, position, consistency, mobility, non-tender and retroverted              Adnexa: no mass, fullness, tenderness               Rectovaginal: Confirms               Anus:  normal sphincter tone, no lesions  Gae Dry chaperoned for the exam.  1. Well woman exam Discussed breast self exam Discussed calcium and vit D intake  2. Screening for cervical cancer - Cytology - PAP  3. Encounter for surveillance of contraceptive pills - norethindrone-ethinyl estradiol-FE (LOESTRIN FE) 1-20 MG-MCG tablet; Take 1 tablet by mouth daily.  Dispense: 84 tablet; Refill: 3  4. Screening examination for STD (sexually transmitted disease) - Cytology - PAP

## 2022-06-26 ENCOUNTER — Ambulatory Visit (INDEPENDENT_AMBULATORY_CARE_PROVIDER_SITE_OTHER): Payer: 59 | Admitting: Obstetrics and Gynecology

## 2022-06-26 ENCOUNTER — Encounter: Payer: Self-pay | Admitting: Obstetrics and Gynecology

## 2022-06-26 ENCOUNTER — Other Ambulatory Visit (HOSPITAL_COMMUNITY)
Admission: RE | Admit: 2022-06-26 | Discharge: 2022-06-26 | Disposition: A | Discharge status: Home / Self Care | Payer: 59 | Source: Ambulatory Visit | Attending: Obstetrics and Gynecology | Admitting: Obstetrics and Gynecology

## 2022-06-26 VITALS — BP 122/64 | HR 98 | Ht 61.25 in | Wt 103.0 lb

## 2022-06-26 DIAGNOSIS — Z113 Encounter for screening for infections with a predominantly sexual mode of transmission: Secondary | ICD-10-CM

## 2022-06-26 DIAGNOSIS — Z01419 Encounter for gynecological examination (general) (routine) without abnormal findings: Secondary | ICD-10-CM

## 2022-06-26 DIAGNOSIS — Z124 Encounter for screening for malignant neoplasm of cervix: Secondary | ICD-10-CM

## 2022-06-26 DIAGNOSIS — Z3041 Encounter for surveillance of contraceptive pills: Secondary | ICD-10-CM

## 2022-06-26 MED ORDER — NORETHIN ACE-ETH ESTRAD-FE 1-20 MG-MCG PO TABS: 1.0000 | ORAL_TABLET | Freq: Every day | ORAL | 3 refills | Status: AC

## 2022-06-26 NOTE — Patient Instructions (Signed)

## 2022-06-28 LAB — CYTOLOGY - PAP
Chlamydia: NEGATIVE
Comment: NEGATIVE
Comment: NEGATIVE
Comment: NORMAL
Diagnosis: NEGATIVE
Neisseria Gonorrhea: NEGATIVE
Trichomonas: NEGATIVE

## 2023-01-15 ENCOUNTER — Other Ambulatory Visit: Payer: Self-pay

## 2023-01-15 ENCOUNTER — Emergency Department (HOSPITAL_BASED_OUTPATIENT_CLINIC_OR_DEPARTMENT_OTHER)
Admission: EM | Admit: 2023-01-15 | Discharge: 2023-01-15 | Disposition: A | Discharge status: Home / Self Care | Payer: 59 | Attending: Emergency Medicine | Admitting: Emergency Medicine

## 2023-01-15 DIAGNOSIS — A059 Bacterial foodborne intoxication, unspecified: Secondary | ICD-10-CM | POA: Insufficient documentation

## 2023-01-15 DIAGNOSIS — R197 Diarrhea, unspecified: Secondary | ICD-10-CM

## 2023-01-15 DIAGNOSIS — R7401 Elevation of levels of liver transaminase levels: Secondary | ICD-10-CM | POA: Diagnosis not present

## 2023-01-15 DIAGNOSIS — R7989 Other specified abnormal findings of blood chemistry: Secondary | ICD-10-CM

## 2023-01-15 DIAGNOSIS — R112 Nausea with vomiting, unspecified: Secondary | ICD-10-CM | POA: Diagnosis present

## 2023-01-15 LAB — COMPREHENSIVE METABOLIC PANEL
ALT: 238 U/L — ABNORMAL HIGH (ref 0–44)
AST: 130 U/L — ABNORMAL HIGH (ref 15–41)
Albumin: 4.8 g/dL (ref 3.5–5.0)
Alkaline Phosphatase: 45 U/L (ref 38–126)
Anion gap: 16 — ABNORMAL HIGH (ref 5–15)
BUN: 14 mg/dL (ref 6–20)
CO2: 23 mmol/L (ref 22–32)
Calcium: 9.9 mg/dL (ref 8.9–10.3)
Chloride: 101 mmol/L (ref 98–111)
Creatinine, Ser: 0.9 mg/dL (ref 0.44–1.00)
GFR, Estimated: >60 mL/min (ref >60)
Glucose, Bld: 150 mg/dL — ABNORMAL HIGH (ref 70–99)
Potassium: 3.8 mmol/L (ref 3.5–5.1)
Sodium: 140 mmol/L (ref 135–145)
Total Bilirubin: 1 mg/dL (ref 0.3–1.2)
Total Protein: 7.6 g/dL (ref 6.5–8.1)

## 2023-01-15 LAB — CBC
HCT: 44.1 % (ref 36.0–46.0)
Hemoglobin: 15 g/dL (ref 12.0–15.0)
MCH: 28.4 pg (ref 26.0–34.0)
MCHC: 34 g/dL (ref 30.0–36.0)
MCV: 83.4 fL (ref 80.0–100.0)
Platelets: 336 10*3/uL (ref 150–400)
RBC: 5.29 MIL/uL — ABNORMAL HIGH (ref 3.87–5.11)
RDW: 13.3 % (ref 11.5–15.5)
WBC: 12.6 10*3/uL — ABNORMAL HIGH (ref 4.0–10.5)
nRBC: 0 % (ref 0.0–0.2)

## 2023-01-15 LAB — URINALYSIS, ROUTINE W REFLEX MICROSCOPIC
Bilirubin Urine: NEGATIVE
Glucose, UA: NEGATIVE mg/dL
Hgb urine dipstick: NEGATIVE
Ketones, ur: 40 mg/dL — AB
Leukocytes,Ua: NEGATIVE
Nitrite: NEGATIVE
Specific Gravity, Urine: 1.022 (ref 1.005–1.030)
pH: 6.5 (ref 5.0–8.0)

## 2023-01-15 LAB — LIPASE, BLOOD: Lipase: <10 U/L — ABNORMAL LOW (ref 11–51)

## 2023-01-15 LAB — PREGNANCY, URINE: Preg Test, Ur: NEGATIVE

## 2023-01-15 MED ORDER — ONDANSETRON 4 MG PO TBDP: 4.0000 mg | ORAL_TABLET | Freq: Three times a day (TID) | ORAL | 0 refills | Status: AC | PRN

## 2023-01-15 MED ORDER — SODIUM CHLORIDE 0.9 % IV BOLUS
1000.0000 mL | Freq: Once | INTRAVENOUS | Status: AC
  Administered 2023-01-15: 1000 mL via INTRAVENOUS

## 2023-01-15 MED ORDER — ONDANSETRON HCL 4 MG/2ML IJ SOLN
4.0000 mg | Freq: Once | INTRAMUSCULAR | Status: AC
Start: 2023-01-15 — End: 2023-01-15
  Administered 2023-01-15: 4 mg via INTRAVENOUS
  Filled 2023-01-15: qty 2

## 2023-01-15 NOTE — Discharge Instructions (Signed)
You were seen today for likely food poisoning versus gastroenteritis.  Your workup was only noted for mild elevated LFTs.  You need to follow-up with your primary doctor for recheck in 1 week.  Otherwise take Zofran as needed for nausea and vomiting.  Clear liquid diet until you are feeling better.

## 2023-01-15 NOTE — ED Provider Notes (Signed)
[place] EMERGENCY DEPARTMENT AT [facility] Provider Note   CSN: [id number] Arrival date & time: 01/15/23  0146     History  Chief Complaint  Patient presents with   Abdominal Pain    Victoria Conway is a 22 y.o. female.  HPI     This is a 22 year old female who presents with nausea, vomiting, and diarrhea.  Patient reports that she ate Dione Plover with a friend last night.  Shortly after eating, they both had onset of retching and diarrhea.  Patient reports she has had multiple episodes of nonbilious, nonbloody emesis and diarrhea since that time.  She believes she may have food poisoning.  She is not having any fever.  Felt well prior to this.  Reports some crampy middle abdominal pain that comes and goes.  Home Medications Prior to Admission medications   Medication Sig Start Date End Date Taking? Authorizing Provider  ondansetron (ZOFRAN-ODT) 4 MG disintegrating tablet Take 1 tablet (4 mg total) by mouth every 8 (eight) hours as needed for nausea or vomiting. 01/15/23  Yes Allina Riches, Mayer Masker, MD  amitriptyline (ELAVIL) 25 MG tablet Take 1 tablet (25 mg total) by mouth at bedtime. Patient not taking: Reported on 06/26/2022 05/21/19   Keturah Shavers, MD  Aspirin-Acetaminophen-Caffeine (PAMPRIN MAX PO) Take by mouth.    [provider]  buPROPion (WELLBUTRIN XL) 150 MG 24 hr tablet Take 150 mg by mouth every morning. 06/19/22   [provider]  ibuprofen (ADVIL) 800 MG tablet Take 800 mg by mouth as needed.    [provider]  norethindrone-ethinyl estradiol-FE (LOESTRIN FE) 1-20 MG-MCG tablet Take 1 tablet by mouth daily. 06/26/22   Romualdo Bolk, MD  ondansetron (ZOFRAN ODT) 4 MG disintegrating tablet Take 1 tablet (4 mg total) by mouth every 8 (eight) hours as needed for nausea or vomiting. 07/27/21   Arthor Captain, PA-C      Allergies    Patient has no known allergies.    Review of Systems   Review of Systems  Constitutional:   Negative for fever.  Gastrointestinal:  Positive for abdominal pain, diarrhea, nausea and vomiting.  All other systems reviewed and are negative.   Physical Exam Updated Vital Signs BP 114/75   Pulse 82   Temp 98.7 F (37.1 C) (Oral)   Resp 18   Ht 1.575 m (5\' 2" )   Wt 44.5 kg   LMP 01/14/2023   SpO2 100%   BMI 17.92 kg/m  Physical Exam Vitals and nursing note reviewed.  Constitutional:      Appearance: She is well-developed.  HENT:     Head: Normocephalic and atraumatic.  Eyes:     Pupils: Pupils are equal, round, and reactive to light.  Cardiovascular:     Rate and Rhythm: Normal rate and regular rhythm.     Heart sounds: Normal heart sounds.  Pulmonary:     Effort: Pulmonary effort is normal. No respiratory distress.     Breath sounds: No wheezing.  Abdominal:     General: Bowel sounds are normal.     Palpations: Abdomen is soft.     Tenderness: There is abdominal tenderness in the epigastric area. There is no guarding or rebound.  Musculoskeletal:     Cervical back: Neck supple.  Skin:    General: Skin is warm and dry.  Neurological:     Mental Status: She is alert and oriented to person, place, and time.  Psychiatric:  Mood and Affect: Mood normal.     ED Results / Procedures / Treatments   Labs (all labs ordered are listed, but only abnormal results are displayed) Labs Reviewed  LIPASE, BLOOD - Abnormal; Notable for the following components:      Result Value   Lipase <10 (*)    All other components within normal limits  COMPREHENSIVE METABOLIC PANEL - Abnormal; Notable for the following components:   Glucose, Bld 150 (*)    AST 130 (*)    ALT 238 (*)    Anion gap 16 (*)    All other components within normal limits  CBC - Abnormal; Notable for the following components:   WBC 12.6 (*)    RBC 5.29 (*)    All other components within normal limits  URINALYSIS, ROUTINE W REFLEX MICROSCOPIC - Abnormal; Notable for the following components:    Ketones, ur 40 (*)    Protein, ur TRACE (*)    All other components within normal limits  PREGNANCY, URINE    EKG None  Radiology No results found.  Procedures Procedures    Medications Ordered in ED Medications  sodium chloride 0.9 % bolus 1,000 mL (0 mLs Intravenous Stopped 01/15/23 0459)  ondansetron (ZOFRAN) injection 4 mg (4 mg Intravenous Given 01/15/23 0421)    ED Course/ Medical Decision Making/ A&P                             Medical Decision Making Amount and/or Complexity of Data Reviewed Labs: ordered.  Risk Prescription drug management.   This patient presents to the ED for concern of abdominal pain, nausea, vomiting, this involves an extensive number of treatment options, and is a complaint that carries with it a high risk of complications and morbidity.  I considered the following differential and admission for this acute, potentially life threatening condition.  The differential diagnosis includes food poisoning, gastritis, gastroenteritis, cholecystitis, pancreatitis, SBO  MDM:    This is a 22 year old female who presents with nausea, vomiting, diarrhea, crampy abdominal pain.  This was after eating Dione Plover.  She is nontoxic.  Vital signs are reassuring.  Reports some improvement after Zofran.  Labs obtained and reviewed.  Normal lipase.  No leukocytosis.  LFTs slightly elevated with AST of 130 and ALT of 238.  Patient was given fluids.  On my exam, her exam is fairly benign.  We discussed the utility of imaging given elevated LFTs.  This could be explained by food poisoning and/or viral illness.  At this time given that she feels better, she would like to forego imaging.  Feel this is reasonable although she will need repeat LFT testing by her primary physician within the week.  She was given fluids and was able to orally hydrate.  Will discharge with Zofran.  (Labs, imaging, consults)  Labs: I Ordered, and personally interpreted labs.  The pertinent  results include: CBC, CMP, lipase  Imaging Studies ordered: I ordered imaging studies including none I independently visualized and interpreted imaging. I agree with the radiologist interpretation  Additional history obtained from family at bedside.  External records from outside source obtained and reviewed including prior evaluations  Cardiac Monitoring: The patient was not maintained on a cardiac monitor.  If on the cardiac monitor, I personally viewed and interpreted the cardiac monitored which showed an underlying rhythm of: N/A  Reevaluation: After the interventions noted above, I reevaluated the patient and found that they  have :improved  Social Determinants of Health:  lives independently  Disposition: Discharge  Co morbidities that complicate the patient evaluation  Past Medical History:  Diagnosis Date   ADHD    Migraine without aura      Medicines Meds ordered this encounter  Medications   sodium chloride 0.9 % bolus 1,000 mL   ondansetron (ZOFRAN) injection 4 mg   ondansetron (ZOFRAN-ODT) 4 MG disintegrating tablet    Sig: Take 1 tablet (4 mg total) by mouth every 8 (eight) hours as needed for nausea or vomiting.    Dispense:  20 tablet    Refill:  0    I have reviewed the patients home medicines and have made adjustments as needed  Problem List / ED Course: Problem List Items Addressed This Visit   None Visit Diagnoses     Vomiting and diarrhea    -  Primary   Food poisoning       Elevated LFTs                       Final Clinical Impression(s) / ED Diagnoses Final diagnoses:  Vomiting and diarrhea  Food poisoning  Elevated LFTs    Rx / DC Orders ED Discharge Orders          Ordered    ondansetron (ZOFRAN-ODT) 4 MG disintegrating tablet  Every 8 hours PRN        01/15/23 0536              Shon Baton, MD 01/15/23 [phone]

## 2023-01-15 NOTE — ED Triage Notes (Signed)
Reports diffuse abd pain and N/V/D with onset 1800 on 01/14/23. Mother reports giving Zofran around 0100.

## 2023-04-06 ENCOUNTER — Telehealth: Payer: Self-pay

## 2023-04-06 NOTE — Telephone Encounter (Signed)
JJ pt calling to report ~2 weeks now lower abdominal pelvic cramping/discomfort, states at its worse can rate pain 10/10. Pt reports tums helps but tylenol doesn't really, showers can help also but most of the time is relieved after using restroom.  Has hx of GI issues but has only seen PCP, not a GI and no etiology has been found.  States last BM was about ago and it was loose.  LMP: 04/02/2023, pt reports that heavier days of cycle were heavier than normal, denies clotting. Pt report at its heaviest, would soak through in a few minutes. No longer bleeding now.   Pt is currently SA but denies missing any doses of COCs. Pt states she is calling d/t mother having concerns of pt having an ectopic pregnancy.  Pt reports temp of 99.27F yesterday and 100.46F today.   Pt also reports some nausea & fatigue.  Advised while waiting on advice from provider to take at home UPT and COVID test at early convenience. Pt voiced understanding.

## 2023-04-06 NOTE — Telephone Encounter (Signed)
Per ML: "She needs to go to an Urgent Care to do STI screen, U/A reflex culture, CBC, UPT. Dr Seymour Bars"  Pt notified and voiced understanding. Will route to provider for final review and close.

## 2023-04-10 ENCOUNTER — Other Ambulatory Visit: Payer: Self-pay

## 2023-04-10 ENCOUNTER — Emergency Department (HOSPITAL_COMMUNITY)
Admission: EM | Admit: 2023-04-10 | Discharge: 2023-04-10 | Disposition: A | Discharge status: Home / Self Care | Payer: 59 | Attending: Emergency Medicine | Admitting: Emergency Medicine

## 2023-04-10 ENCOUNTER — Emergency Department (HOSPITAL_COMMUNITY): Payer: 59

## 2023-04-10 DIAGNOSIS — K769 Liver disease, unspecified: Secondary | ICD-10-CM

## 2023-04-10 DIAGNOSIS — Z79899 Other long term (current) drug therapy: Secondary | ICD-10-CM | POA: Diagnosis not present

## 2023-04-10 DIAGNOSIS — K7689 Other specified diseases of liver: Secondary | ICD-10-CM | POA: Diagnosis not present

## 2023-04-10 DIAGNOSIS — R109 Unspecified abdominal pain: Secondary | ICD-10-CM | POA: Diagnosis not present

## 2023-04-10 DIAGNOSIS — Z7982 Long term (current) use of aspirin: Secondary | ICD-10-CM | POA: Diagnosis not present

## 2023-04-10 LAB — LIPASE, BLOOD: Lipase: 32 U/L (ref 11–51)

## 2023-04-10 LAB — COMPREHENSIVE METABOLIC PANEL
ALT: 13 U/L (ref 0–44)
AST: 15 U/L (ref 15–41)
Albumin: 4.2 g/dL (ref 3.5–5.0)
Alkaline Phosphatase: 33 U/L — ABNORMAL LOW (ref 38–126)
Anion gap: 9 (ref 5–15)
BUN: 16 mg/dL (ref 6–20)
CO2: 23 mmol/L (ref 22–32)
Calcium: 8.9 mg/dL (ref 8.9–10.3)
Chloride: 106 mmol/L (ref 98–111)
Creatinine, Ser: 0.75 mg/dL (ref 0.44–1.00)
GFR, Estimated: >60 mL/min (ref >60)
Glucose, Bld: 87 mg/dL (ref 70–99)
Potassium: 4.3 mmol/L (ref 3.5–5.1)
Sodium: 138 mmol/L (ref 135–145)
Total Bilirubin: 0.9 mg/dL (ref 0.3–1.2)
Total Protein: 6.8 g/dL (ref 6.5–8.1)

## 2023-04-10 LAB — CBC
HCT: 37.3 % (ref 36.0–46.0)
Hemoglobin: 12.7 g/dL (ref 12.0–15.0)
MCH: 29 pg (ref 26.0–34.0)
MCHC: 34 g/dL (ref 30.0–36.0)
MCV: 85.2 fL (ref 80.0–100.0)
Platelets: 252 10*3/uL (ref 150–400)
RBC: 4.38 MIL/uL (ref 3.87–5.11)
RDW: 13 % (ref 11.5–15.5)
WBC: 7.2 10*3/uL (ref 4.0–10.5)
nRBC: 0 % (ref 0.0–0.2)

## 2023-04-10 LAB — URINALYSIS, ROUTINE W REFLEX MICROSCOPIC
Bilirubin Urine: NEGATIVE
Glucose, UA: NEGATIVE mg/dL
Hgb urine dipstick: NEGATIVE
Ketones, ur: 80 mg/dL — AB
Nitrite: NEGATIVE
Protein, ur: 30 mg/dL — AB
Specific Gravity, Urine: 1.038 — ABNORMAL HIGH (ref 1.005–1.030)
pH: 5 (ref 5.0–8.0)

## 2023-04-10 LAB — HCG, SERUM, QUALITATIVE: Preg, Serum: NEGATIVE

## 2023-04-10 MED ORDER — IOHEXOL 300 MG/ML  SOLN
100.0000 mL | Freq: Once | INTRAMUSCULAR | Status: AC | PRN
  Administered 2023-04-10: 80 mL via INTRAVENOUS

## 2023-04-10 MED ORDER — SODIUM CHLORIDE 0.9 % IV BOLUS: 1000.0000 mL | Freq: Once | INTRAVENOUS | Status: DC

## 2023-04-10 MED ORDER — ONDANSETRON HCL 4 MG/2ML IJ SOLN
4.0000 mg | Freq: Once | INTRAMUSCULAR | Status: AC
  Administered 2023-04-10: 4 mg via INTRAVENOUS
  Filled 2023-04-10: qty 2

## 2023-04-10 MED ORDER — KETOROLAC TROMETHAMINE 30 MG/ML IJ SOLN
30.0000 mg | Freq: Once | INTRAMUSCULAR | Status: AC
  Administered 2023-04-10: 30 mg via INTRAVENOUS
  Filled 2023-04-10: qty 1

## 2023-04-10 MED ORDER — ONDANSETRON 4 MG PO TBDP: 4.0000 mg | ORAL_TABLET | Freq: Three times a day (TID) | ORAL | 0 refills | Status: AC | PRN

## 2023-04-10 NOTE — ED Provider Notes (Signed)
[place] EMERGENCY DEPARTMENT AT [facility] Provider Note   CSN: [id number] Arrival date & time: 04/10/23  1306     History  Chief Complaint  Patient presents with   Abdominal Pain    Victoria Conway is a 22 y.o. female presenting to ED with 2 weeks of cramping lower abdominal pain.  Patient reports predominantly periumbilical abdominal pain, waxing and waning but nearly daily in frequency.  She reports nausea and several episodes of vomiting.  She reports loose bowel movements.  She has never had this pain before.  She denies dysuria or hematuria.  She denies history of abdominal surgery.  She was seen by her PCP today advised to come to the ED out of concern for potential infection or inflammatory process.  The patient separately reports that about a week ago she began having subjective fevers.  HPI     Home Medications Prior to Admission medications   Medication Sig Start Date End Date Taking? Authorizing Provider  ondansetron (ZOFRAN-ODT) 4 MG disintegrating tablet Take 1 tablet (4 mg total) by mouth every 8 (eight) hours as needed for up to 15 doses. 04/10/23  Yes Jushua Waltman, Kermit Balo, MD  amitriptyline (ELAVIL) 25 MG tablet Take 1 tablet (25 mg total) by mouth at bedtime. Patient not taking: Reported on 06/26/2022 05/21/19   Keturah Shavers, MD  Aspirin-Acetaminophen-Caffeine (PAMPRIN MAX PO) Take by mouth.    [provider]  buPROPion (WELLBUTRIN XL) 150 MG 24 hr tablet Take 150 mg by mouth every morning. 06/19/22   [provider]  ibuprofen (ADVIL) 800 MG tablet Take 800 mg by mouth as needed.    [provider]  norethindrone-ethinyl estradiol-FE (LOESTRIN FE) 1-20 MG-MCG tablet Take 1 tablet by mouth daily. 06/26/22   Romualdo Bolk, MD  ondansetron (ZOFRAN ODT) 4 MG disintegrating tablet Take 1 tablet (4 mg total) by mouth every 8 (eight) hours as needed for nausea or vomiting. 07/27/21   Arthor Captain, PA-C  ondansetron (ZOFRAN-ODT)  4 MG disintegrating tablet Take 1 tablet (4 mg total) by mouth every 8 (eight) hours as needed for nausea or vomiting. 01/15/23   Horton, Mayer Masker, MD      Allergies    Patient has no known allergies.    Review of Systems   Review of Systems  Physical Exam Updated Vital Signs BP 119/82   Pulse 71   Temp 98.2 F (36.8 C) (Oral)   Resp 18   Ht 5\' 2"  (1.575 m)   Wt 97.2 kg   SpO2 100%   BMI 39.19 kg/m  Physical Exam Constitutional:      General: She is not in acute distress. HENT:     Head: Normocephalic and atraumatic.  Eyes:     Conjunctiva/sclera: Conjunctivae normal.     Pupils: Pupils are equal, round, and reactive to light.  Cardiovascular:     Rate and Rhythm: Normal rate and regular rhythm.  Pulmonary:     Effort: Pulmonary effort is normal. No respiratory distress.  Abdominal:     General: There is no distension.     Tenderness: There is abdominal tenderness in the periumbilical area. There is no guarding or rebound. Negative signs include Murphy's sign and McBurney's sign.     Comments: Very mild periumbilical tenderness  Skin:    General: Skin is warm and dry.  Neurological:     General: No focal deficit present.     Mental Status: She is alert. Mental status is at baseline.  Psychiatric:        Mood and Affect: Mood normal.        Behavior: Behavior normal.     ED Results / Procedures / Treatments   Labs (all labs ordered are listed, but only abnormal results are displayed) Labs Reviewed  COMPREHENSIVE METABOLIC PANEL - Abnormal; Notable for the following components:      Result Value   Alkaline Phosphatase 33 (*)    All other components within normal limits  URINALYSIS, ROUTINE W REFLEX MICROSCOPIC - Abnormal; Notable for the following components:   APPearance HAZY (*)    Specific Gravity, Urine 1.038 (*)    Ketones, ur 80 (*)    Protein, ur 30 (*)    Leukocytes,Ua TRACE (*)    Bacteria, UA RARE (*)    All other components within normal limits   LIPASE, BLOOD  CBC  HCG, SERUM, QUALITATIVE  GC/CHLAMYDIA PROBE AMP (Conception) NOT AT [facility]    EKG None  Radiology CT ABDOMEN PELVIS W CONTRAST  Result Date: 04/10/2023 CLINICAL DATA:  Right lower quadrant abdominal pain over the last 2 weeks. EXAM: CT ABDOMEN AND PELVIS WITH CONTRAST TECHNIQUE: Multidetector CT imaging of the abdomen and pelvis was performed using the standard protocol following bolus administration of intravenous contrast. RADIATION DOSE REDUCTION: This exam was performed according to the departmental dose-optimization program which includes automated exposure control, adjustment of the mA and/or kV according to patient size and/or use of iterative reconstruction technique. CONTRAST:  80mL OMNIPAQUE IOHEXOL 300 MG/ML  SOLN COMPARISON:  None Available. FINDINGS: Lower chest: Normal Hepatobiliary: No acute liver finding. Hyperdense or hyperenhancing focus in the dome of the liver adjacent to the IVC measuring 2.7 cm in diameter. This could represent an adenoma or focal nodular hyperplasia. MRI of the liver with and without contrast would be recommended electively to further evaluate. Small bit of benign focal fat adjacent to the falciform ligament, not significant. No calcified gallstones. Pancreas: Normal Spleen: Normal Adrenals/Urinary Tract: Adrenal glands are normal. Kidneys are normal. No stone, mass or hydronephrosis. Bladder is normal. Stomach/Bowel: Stomach and small intestine are normal. I cannot specifically identify the appendix. I do not see any inflammatory change in the right lower quadrant. No abnormality of the colon is seen. Vascular/Lymphatic: Aorta and IVC are normal.  No adenopathy. Reproductive: No mass.  Retroverted uterus. Other: No free fluid or air. Musculoskeletal: Normal IMPRESSION: 1. No acute finding by CT. I cannot specifically identify the appendix. No inflammatory change is identified in the right lower quadrant. 2. Hyperdense or hyperenhancing focus  in the dome of the liver adjacent to the IVC measuring 2.7 cm in diameter. This could represent an adenoma or focal nodular hyperplasia. MRI of the liver with and without contrast would be recommended electively to further evaluate. Electronically Signed   By: Paulina Fusi M.D.   On: 04/10/2023 17:43    Procedures Procedures    Medications Ordered in ED Medications  ketorolac (TORADOL) 30 MG/ML injection 30 mg (30 mg Intravenous Given 04/10/23 1648)  iohexol (OMNIPAQUE) 300 MG/ML solution 100 mL (80 mLs Intravenous Contrast Given 04/10/23 1712)  ondansetron (ZOFRAN) injection 4 mg (4 mg Intravenous Given 04/10/23 1729)    ED Course/ Medical Decision Making/ A&P Clinical Course as of 04/10/23 2011  Tue Apr 10, 2023  1816 Patient reassessed remained stable.  We do not see any concerning findings for acute appendicitis, although I did explain that she did not have a fully visualize appendix on her CT  scan.  However, given that she does not have focal tenderness in the right lower quadrant on exam, has no leukocytosis, no surrounding inflammatory changes on CT, my suspicion for acute appendicitis is quite low at this time. [MT]  1816 In private I discussed with the patient potential risk for STI.  She has been not monogamous with 1 female partner.  She says she has not been tested in some time.  However she denies any vaginal discharge and overall feels she has a very low suspicion for STI.  She prefers to test and self-swab for Gc/chlamdyia, which we can do.   [MT]  1817 We discussed the incidental liver finding and need for follow-up.  Also discussed her UA which appears concentrated need for better hydration.  She verbalized understanding [MT]    Clinical Course User Index [MT] Jenkins Risdon, Kermit Balo, MD                             Medical Decision Making Amount and/or Complexity of Data Reviewed Radiology: ordered.  Risk Prescription drug management.   This patient presents to the ED with  concern for lower abdominal pain, nausea vomiting. This involves an extensive number of treatment options, and is a complaint that carries with it a high risk of complications and morbidity.  The differential diagnosis includes colitis versus cystitis versus appendicitis versus other  I ordered and personally interpreted labs.  The pertinent results include:   No emergent findings on basic blood work.  UA showing no clear evidence of infection  I ordered imaging studies including CT abdomen pelvis with contrast I independently visualized and interpreted imaging which showed no emergent findings.  Incidental findings noted I agree with the radiologist interpretation  The patient was maintained on a cardiac monitor.  I personally viewed and interpreted the cardiac monitored which showed an underlying rhythm of: NSR  I ordered medication including toradol for pain  I have reviewed the patients home medicines and have made adjustments as needed  Test Considered: doubt PID, ovarian torsion - given GI symptoms (loose BM, nausea vomiting)  After the interventions noted above, I reevaluated the patient and found that they have: improved   Dispostion:  After consideration of the diagnostic results and the patients response to treatment, I feel that the patent would benefit from outpatient follow up.         Final Clinical Impression(s) / ED Diagnoses Final diagnoses:  Abdominal pain, unspecified abdominal location  Liver lesion    Rx / DC Orders ED Discharge Orders          Ordered    ondansetron (ZOFRAN-ODT) 4 MG disintegrating tablet  Every 8 hours PRN        04/10/23 1820              Terald Sleeper, MD 04/10/23 2012

## 2023-04-10 NOTE — ED Triage Notes (Signed)
Pt reports abdominal pain "below bellybutton" x 2 weeks. N/V/D

## 2023-04-10 NOTE — Discharge Instructions (Signed)
You can follow-up on your patient portal for the results of your sexual disease panel, including chlamydia and gonorrhea testing, which usually take 5 to 7 days.  Please refrain from sex until you have seen your results.  If you test positive you will need to be treated with antibiotics, and you will need to notify all of your prior sexual partners about testing and treatment.  We discussed some of the incidental findings on your workup today.  Your urinalysis shows that you may be dehydrated.  Continue to drink plenty of water at home.  I prescribed you nausea medicine which will help with this.  Your CT scan showed that you may have an adenoma or another "lesion" near your liver.  These are often benign and incidental findings, but it is recommended that you follow up with your primary care provider.  The recommendation is to perform an outpatient MRI of the liver with and without contrast.

## 2023-04-11 LAB — GC/CHLAMYDIA PROBE AMP (~~LOC~~) NOT AT ARMC
Chlamydia: NEGATIVE
Comment: NEGATIVE
Comment: NORMAL
Neisseria Gonorrhea: NEGATIVE

## 2023-04-13 ENCOUNTER — Other Ambulatory Visit: Payer: Self-pay | Admitting: Registered Nurse

## 2023-04-13 DIAGNOSIS — K769 Liver disease, unspecified: Secondary | ICD-10-CM

## 2023-05-06 ENCOUNTER — Ambulatory Visit
Admission: RE | Admit: 2023-05-06 | Discharge: 2023-05-06 | Disposition: A | Discharge status: Home / Self Care | Payer: 59 | Source: Ambulatory Visit | Attending: Registered Nurse | Admitting: Registered Nurse

## 2023-05-06 DIAGNOSIS — K769 Liver disease, unspecified: Secondary | ICD-10-CM

## 2023-05-06 MED ORDER — GADOPICLENOL 0.5 MMOL/ML IV SOLN
5.0000 mL | Freq: Once | INTRAVENOUS | Status: AC | PRN
  Administered 2023-05-06: 5 mL via INTRAVENOUS

## 2023-05-15 ENCOUNTER — Encounter: Payer: Self-pay | Admitting: Internal Medicine

## 2023-08-09 ENCOUNTER — Other Ambulatory Visit (INDEPENDENT_AMBULATORY_CARE_PROVIDER_SITE_OTHER): Payer: 59

## 2023-08-09 ENCOUNTER — Ambulatory Visit: Payer: 59 | Admitting: Internal Medicine

## 2023-08-09 ENCOUNTER — Encounter: Payer: Self-pay | Admitting: Internal Medicine

## 2023-08-09 ENCOUNTER — Other Ambulatory Visit: Payer: Self-pay | Admitting: Internal Medicine

## 2023-08-09 VITALS — BP 110/70 | HR 83 | Ht 62.0 in | Wt 103.0 lb

## 2023-08-09 DIAGNOSIS — K769 Liver disease, unspecified: Secondary | ICD-10-CM | POA: Diagnosis not present

## 2023-08-09 DIAGNOSIS — R112 Nausea with vomiting, unspecified: Secondary | ICD-10-CM

## 2023-08-09 DIAGNOSIS — R109 Unspecified abdominal pain: Secondary | ICD-10-CM | POA: Diagnosis not present

## 2023-08-09 DIAGNOSIS — R197 Diarrhea, unspecified: Secondary | ICD-10-CM

## 2023-08-09 DIAGNOSIS — R7989 Other specified abnormal findings of blood chemistry: Secondary | ICD-10-CM

## 2023-08-09 LAB — C-REACTIVE PROTEIN: CRP: <1 mg/dL (ref 0.5–20.0)

## 2023-08-09 MED ORDER — ONDANSETRON 4 MG PO TBDP: 4.0000 mg | ORAL_TABLET | Freq: Three times a day (TID) | ORAL | 0 refills | Status: AC | PRN

## 2023-08-09 MED ORDER — DICYCLOMINE HCL 10 MG PO CAPS: 10.0000 mg | ORAL_CAPSULE | Freq: Three times a day (TID) | ORAL | 0 refills | Status: DC | PRN

## 2023-08-09 NOTE — Progress Notes (Signed)
Chief Complaint: Elevated LFTs, liver lesion  HPI : 22 year old female with history of ADHD and migraines presents with elevated LFTs and liver lesion  Patient was seen in the ED on 01/15/2023 with nausea, vomiting, abdominal pain, and diarrhea after eating Dione [employer].  Patient was diagnosed with likely infectious gastroenteritis and was found to have elevated AST and ALT at that time.  She went to the ED again on 04/10/2023 with cramping lower abdominal pain.  She was given Zofran and CT imaging showed a hyperenhancing focus in the dome of the liver.  Follow-up MRI again showed this liver lesion that is favored to be a an hepatic adenoma or focal nodular hyperplasia.  About 3-4 days ago she vomited at work. She has been having episodes of GI symptom exacerbations where she has ab pain, nausea, and diarrhea.  The abdominal pain is located in the right upper quadrant and periumbilical area.  The abdominal pain feels like cramps.  The episodes last for 1-1.5 weeks.  During her episodes of GI symptoms, patient is not able to eat.  Once the episode resolves, she is not able to pass a BM.  Endorses fevers where she will wake up with sweats. Temp has mostly been in the 99 degree F range. Endorses chronically feeling bloated. Denies any antibiotics use. Denies dysuria. Denies blood in the stools. When she has diarrhea, she has 1-2 BMs per day. Endorses fecal urgency. Denies nocturnal stools. She ran out of her nausea medications. Endorses marijuana use for years.  She has been cutting down on her marijuana use over time.  Denies dysphagia, chest burning, or regurgitation.  She denies personal history of liver disease prior to 12/2022 and denies family history of liver disease.  Wt Readings from Last 3 Encounters:  08/09/23 103 lb (46.7 kg)  04/10/23 214 lb 4.6 oz (97.2 kg)  01/15/23 98 lb (44.5 kg)   Past Medical History:  Diagnosis Date   ADHD    Migraine without aura      Past Surgical History:   Procedure Laterality Date   MULTIPLE TOOTH EXTRACTIONS     WISDOM TOOTH EXTRACTION     Family History  Adopted: Yes  Problem Relation Age of Onset   Breast cancer Maternal Grandmother    Thyroid disease Paternal Grandmother    Uterine cancer Paternal Grandmother    Breast cancer Maternal Aunt    Migraines Neg Hx    Seizures Neg Hx    Colon cancer Neg Hx    Stomach cancer Neg Hx    Esophageal cancer Neg Hx    Social History   Tobacco Use   Smoking status: Former    Types: E-cigarettes   Smokeless tobacco: Never  Vaping Use   Vaping status: Every Day   Substances: Flavoring  Substance Use Topics   Alcohol use: Yes    Comment: Seldom   Drug use: Yes    Frequency: 7.0 times per week    Types: Marijuana   Current Outpatient Medications  Medication Sig Dispense Refill   Aspirin-Acetaminophen-Caffeine (PAMPRIN MAX PO) Take by mouth.     buPROPion (WELLBUTRIN XL) 150 MG 24 hr tablet Take 150 mg by mouth every morning.     DULoxetine (CYMBALTA) 20 MG capsule Take 20 mg by mouth daily.     ibuprofen (ADVIL) 800 MG tablet Take 800 mg by mouth as needed.     norethindrone-ethinyl estradiol-FE (LOESTRIN FE) 1-20 MG-MCG tablet Take 1 tablet by mouth daily. 84  tablet 3   ondansetron (ZOFRAN ODT) 4 MG disintegrating tablet Take 1 tablet (4 mg total) by mouth every 8 (eight) hours as needed for nausea or vomiting. 20 tablet 0   ondansetron (ZOFRAN-ODT) 4 MG disintegrating tablet Take 1 tablet (4 mg total) by mouth every 8 (eight) hours as needed for nausea or vomiting. 20 tablet 0   amitriptyline (ELAVIL) 25 MG tablet Take 1 tablet (25 mg total) by mouth at bedtime. (Patient not taking: Reported on 06/26/2022) 90 tablet 2   ondansetron (ZOFRAN-ODT) 4 MG disintegrating tablet Take 1 tablet (4 mg total) by mouth every 8 (eight) hours as needed for up to 15 doses. (Patient not taking: Reported on 08/09/2023) 15 tablet 0   No current facility-administered medications for this visit.   No  Known Allergies   Review of Systems: All systems reviewed and negative except where noted in HPI.   Physical Exam: BP 110/70   Pulse 83   Ht 5\' 2"  (1.575 m)   Wt 103 lb (46.7 kg)   BMI 18.84 kg/m  Constitutional: Pleasant,well-developed, female in no acute distress. HEENT: Normocephalic and atraumatic. Conjunctivae are normal. No scleral icterus. Cardiovascular: Normal rate, regular rhythm.  Pulmonary/chest: Effort normal and breath sounds normal. No wheezing, rales or rhonchi. Abdominal: Soft, nondistended, tender in the lower abdomen. Bowel sounds active throughout. There are no masses palpable. No hepatomegaly. Extremities: No edema Neurological: Alert and oriented to person place and time. Skin: Skin is warm and dry. No rashes noted. Psychiatric: Normal mood and affect. Behavior is normal.  Labs 12/2022: CMP with elevated AST 130 and ALT of 238.   Labs 01/2023: ALT is elevated at 172.  AST has normalized to 39.  Hepatitis A total antibody was reactive.  Hepatitis A antibody IgM nonreactive.  Hepatitis B core antibody IgM nonreactive.  Hepatitis B surface antigen nonreactive.  Hepatitis C antibody IgG nonreactive.  TSH normal.  Vitamin D level low at 14.  Labs 03/2023: CBC nml. CMP with nml LFTs. Lipase nml. Beta HCG negative. U/A with trace leukocytes, ketones, protein. GC/CL negative.  CT A/P w/contrast 04/10/23: IMPRESSION: 1. No acute finding by CT. I cannot specifically identify the appendix. No inflammatory change is identified in the right lower quadrant. 2. Hyperdense or hyperenhancing focus in the dome of the liver adjacent to the IVC measuring 2.7 cm in diameter. This could represent an adenoma or focal nodular hyperplasia. MRI of the liver with and without contrast would be recommended electively to further evaluate.  MRI abdomen w/contrast 05/06/23: IMPRESSION: 1. Lesion of the central right lobe of the liver, centered in hepatic segment VIII, measuring 3.0 x 2.2  cm, which is isoenhancing on all remaining sequences. This is consistent with either a benign focal nodular hyperplasia or alternately a hepatic adenoma. Although both differential considerations are benign, there may be further management and follow-up implications for hepatic adenoma. Recommend Eovist enhanced MRI in 6 months to more confidently distinguish between these entities. Please specify Eovist contrast with ordering to ensure its use. 2. Partially callused, likely subacute fracture of the lateral right eighth rib. Correlate for recent trauma.  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  She was found to have elevated LFTs during the ED visit in 12/2022 when she also described symptoms of nausea, vomiting, diarrhea, and abdominal pain.  At that time she was diagnosed  with presumed food poisoning.  Since then, her LFTs have downtrended, and her LFTs are currently completely normal currently.  I suspect at this time that the patient had an infection that led to her elevated LFTs.  If her LFTs were to increase again in the future, could consider further workup at that time.  Patient has continued to have episodes of nausea, vomiting, abdominal pain, and diarrhea ever since 12/2022.  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  However will also rule out a persistent GI infection by performing a [facility] GI pathogen panel.  In the meantime we will have her start Bentyl as needed for abdominal pain and use Zofran as needed to help with nausea.  Her liver lesion noted on CT and MRI imaging is favored to be focal nodular hyperplasia versus hepatic adenoma.  Patient is on a combination oral contraceptive, but she states that she has not been taking it recently.  Patient states that if she decides to restart OCPs, she will reach out to her gynecologist  or PCP to switch to a progesterone only containing OCP.  Reducing estrogen products will decrease the risk of progression for her hepatic adenoma.  Patient also notes that she smokes marijuana so I asked her to continue to reduce her usage since this can sometimes lead to cannabinoid hyperemesis syndrome. - Check CRP, TTG IgA, IgA - [facility] GI pathogen panel - Bentyl 10 mg TID PRN for ab pain - Zofran PRN. Refill - Have her talk to PCP about switching from estrogen containing OCP to progesterone only containing OCP - Will plan for MRI abdomen w/Eovist in 10/2023 - Encourage marijuana cessation - RTC in 4 months   Eulah Pont, MD  I spent 62 minutes of time, including in depth chart review, independent review of results as outlined above, communicating results with the patient directly, face-to-face time with the patient, coordinating care, ordering studies and medications as appropriate, and documentation.

## 2023-08-09 NOTE — Patient Instructions (Signed)
Your provider has requested that you go to the basement level for lab work before leaving today. Press "B" on the elevator. The lab is located at the first door on the left as you exit the elevator.  You have been scheduled for an MRI at [facility] on 11/02/23. Your appointment time is 9 am. Please arrive to admitting (at main entrance of the hospital) 30 minutes prior to your appointment time for registration purposes. Please make certain not to have anything to eat or drink 4 hours prior to your test. In addition, if you have any metal in your body, have a pacemaker or defibrillator, please be sure to let your ordering physician know. This test typically takes 45 minutes to 1 hour to complete. Should you need to reschedule, please call [phone] to do so.  Your provider has ordered "[facility]" stool testing for you. You have received a kit from our office today containing all necessary supplies to complete this test. Please carefully read the stool collection instructions provided in the kit before opening the accompanying materials. In addition, be sure to place the label from the top right corner of the laboratory request sheet onto the "puritan opti-swab" tube that is supplied in the kit. This label should include your full name and date of birth. After completing the test, you should secure the purtian tube into the specimen biohazard bag. The laboratory request information sheet (including date and time of specimen collection) should be placed into the outside pocket of the specimen biohazard bag and returned to the [facility] lab with 2 days of collection.   If the laboratory information sheet specimen date and time are not filled out, the test will NOT be performed.  We have sent the following medications to your pharmacy for you to pick up at your convenience: Bentyl, Zofran  Follow up in 4 months  If your blood pressure at your visit was 140/90 or greater, please contact your primary  care physician to follow up on this.  _______________________________________________________  If you are age 59 or older, your body mass index should be between 23-30. Your Body mass index is 18.84 kg/m. If this is out of the aforementioned range listed, please consider follow up with your Primary Care Provider.  If you are age 9 or younger, your body mass index should be between 19-25. Your Body mass index is 18.84 kg/m. If this is out of the aformentioned range listed, please consider follow up with your Primary Care Provider.   ________________________________________________________  The [facility] providers would like to encourage you to use [facility] to communicate with providers for non-urgent requests or questions.  Due to long hold times on the telephone, sending your provider a message by [facility] may be a faster and more efficient way to get a response.  Please allow 48 business hours for a response.  Please remember that this is for non-urgent requests.  _______________________________________________________

## 2023-08-10 LAB — IGA: Immunoglobulin A: 166 mg/dL (ref 47–310)

## 2023-08-10 LAB — TISSUE TRANSGLUTAMINASE, IGA: (tTG) Ab, IgA: <1 U/mL

## 2023-08-13 ENCOUNTER — Other Ambulatory Visit: Payer: 59

## 2023-08-13 DIAGNOSIS — R112 Nausea with vomiting, unspecified: Secondary | ICD-10-CM

## 2023-08-13 DIAGNOSIS — R197 Diarrhea, unspecified: Secondary | ICD-10-CM

## 2023-08-13 DIAGNOSIS — R109 Unspecified abdominal pain: Secondary | ICD-10-CM

## 2023-08-13 DIAGNOSIS — K769 Liver disease, unspecified: Secondary | ICD-10-CM

## 2023-08-13 DIAGNOSIS — R7989 Other specified abnormal findings of blood chemistry: Secondary | ICD-10-CM

## 2023-08-14 ENCOUNTER — Encounter: Payer: Self-pay | Admitting: Internal Medicine

## 2023-08-14 NOTE — Progress Notes (Signed)
Patient's [facility] GI pathogen stool test 08/09/23 was negative for all infection.   Tisha, please let the patient know that her stool test was negative for all infection.

## 2023-08-15 LAB — GI PROFILE, STOOL, PCR

## 2023-08-15 NOTE — Progress Notes (Signed)
Attempted to call the patient twice without response. I left a voicemail on the second phone call letting her know that I had called. Will try to call her again tomorrow.

## 2023-08-16 ENCOUNTER — Telehealth: Payer: Self-pay

## 2023-08-16 ENCOUNTER — Other Ambulatory Visit: Payer: Self-pay

## 2023-08-16 MED ORDER — VANCOMYCIN HCL 125 MG PO CAPS: 125.0000 mg | ORAL_CAPSULE | Freq: Four times a day (QID) | ORAL | 0 refills | Status: AC

## 2023-08-16 NOTE — Telephone Encounter (Signed)
Spoke with the patient. Advised of plan of care. Vancomycin 125 mg QID x 10 days. Reviewed self-care, including hydration and handwashing after using the toilet. She has a shared bathroom. Discussed recommended cleaning practices. Guided to [facility] website for more information.

## 2023-08-16 NOTE — Telephone Encounter (Signed)
Lab calling to bring attention to the positive stool culture. Stool culture of 08/13/23, resulted on 08/15/23 showing  C difficile toxin A/B - detected. Dr Leonides Schanz already aware. See lab results. Message left for the patient to return call.

## 2023-08-26 ENCOUNTER — Telehealth: Payer: Self-pay | Admitting: Gastroenterology

## 2023-08-26 NOTE — Telephone Encounter (Signed)
Pt called Having persistent nausea/vomiting with flecks of blood.  No frank hematemesis. Finished last dose of vancomycin for C. difficile this morning.  Having 2-3 BMs per day. Also with fever, sore throat Gets dizzy when she stands up Feels she is quite dehydrated Could not keep liquids down.  I have advised her to go over to ED at [facility] for labs, possible IV fluids. Beth, can you please call her tomorrow and see how she is doing.  RG

## 2023-08-27 ENCOUNTER — Encounter (HOSPITAL_BASED_OUTPATIENT_CLINIC_OR_DEPARTMENT_OTHER): Payer: Self-pay | Admitting: Emergency Medicine

## 2023-08-27 ENCOUNTER — Emergency Department (HOSPITAL_BASED_OUTPATIENT_CLINIC_OR_DEPARTMENT_OTHER)
Admission: EM | Admit: 2023-08-27 | Discharge: 2023-08-27 | Disposition: A | Discharge status: Home / Self Care | Payer: 59 | Attending: Emergency Medicine | Admitting: Emergency Medicine

## 2023-08-27 ENCOUNTER — Other Ambulatory Visit: Payer: Self-pay

## 2023-08-27 ENCOUNTER — Emergency Department (HOSPITAL_BASED_OUTPATIENT_CLINIC_OR_DEPARTMENT_OTHER): Payer: 59

## 2023-08-27 DIAGNOSIS — R1033 Periumbilical pain: Secondary | ICD-10-CM | POA: Diagnosis not present

## 2023-08-27 DIAGNOSIS — Z79899 Other long term (current) drug therapy: Secondary | ICD-10-CM | POA: Insufficient documentation

## 2023-08-27 DIAGNOSIS — R197 Diarrhea, unspecified: Secondary | ICD-10-CM | POA: Diagnosis not present

## 2023-08-27 DIAGNOSIS — R112 Nausea with vomiting, unspecified: Secondary | ICD-10-CM | POA: Diagnosis present

## 2023-08-27 DIAGNOSIS — Z7982 Long term (current) use of aspirin: Secondary | ICD-10-CM | POA: Diagnosis not present

## 2023-08-27 LAB — CBC WITH DIFFERENTIAL/PLATELET
Abs Immature Granulocytes: 0.02 10*3/uL (ref 0.00–0.07)
Basophils Absolute: 0 10*3/uL (ref 0.0–0.1)
Basophils Relative: 0 %
Eosinophils Absolute: 0 10*3/uL (ref 0.0–0.5)
Eosinophils Relative: 0 %
HCT: 38.4 % (ref 36.0–46.0)
Hemoglobin: 13.2 g/dL (ref 12.0–15.0)
Immature Granulocytes: 0 %
Lymphocytes Relative: 30 %
Lymphs Abs: 1.8 10*3/uL (ref 0.7–4.0)
MCH: 28 pg (ref 26.0–34.0)
MCHC: 34.4 g/dL (ref 30.0–36.0)
MCV: 81.5 fL (ref 80.0–100.0)
Monocytes Absolute: 0.4 10*3/uL (ref 0.1–1.0)
Monocytes Relative: 7 %
Neutro Abs: 3.7 10*3/uL (ref 1.7–7.7)
Neutrophils Relative %: 63 %
Platelets: 250 10*3/uL (ref 150–400)
RBC: 4.71 MIL/uL (ref 3.87–5.11)
RDW: 12.8 % (ref 11.5–15.5)
WBC: 5.9 10*3/uL (ref 4.0–10.5)
nRBC: 0 % (ref 0.0–0.2)

## 2023-08-27 LAB — I-STAT CHEM 8, ED
BUN: 20 mg/dL (ref 6–20)
Calcium, Ion: 1.21 mmol/L (ref 1.15–1.40)
Chloride: 103 mmol/L (ref 98–111)
Creatinine, Ser: 0.7 mg/dL (ref 0.44–1.00)
Glucose, Bld: 74 mg/dL (ref 70–99)
HCT: 40 % (ref 36.0–46.0)
Hemoglobin: 13.6 g/dL (ref 12.0–15.0)
Potassium: 3.6 mmol/L (ref 3.5–5.1)
Sodium: 136 mmol/L (ref 135–145)
TCO2: 19 mmol/L — ABNORMAL LOW (ref 22–32)

## 2023-08-27 LAB — HEPATIC FUNCTION PANEL
ALT: 18 U/L (ref 0–44)
AST: 19 U/L (ref 15–41)
Albumin: 4.4 g/dL (ref 3.5–5.0)
Alkaline Phosphatase: 34 U/L — ABNORMAL LOW (ref 38–126)
Bilirubin, Direct: 0.2 mg/dL (ref 0.0–0.2)
Indirect Bilirubin: 1.5 mg/dL — ABNORMAL HIGH (ref 0.3–0.9)
Total Bilirubin: 1.7 mg/dL — ABNORMAL HIGH (ref <1.2)
Total Protein: 7.2 g/dL (ref 6.5–8.1)

## 2023-08-27 LAB — HCG, SERUM, QUALITATIVE: Preg, Serum: NEGATIVE

## 2023-08-27 LAB — LIPASE, BLOOD: Lipase: 24 U/L (ref 11–51)

## 2023-08-27 MED ORDER — SODIUM CHLORIDE 0.9 % IV BOLUS
1000.0000 mL | Freq: Once | INTRAVENOUS | Status: AC
  Administered 2023-08-27: 1000 mL via INTRAVENOUS

## 2023-08-27 MED ORDER — IOHEXOL 300 MG/ML  SOLN
100.0000 mL | Freq: Once | INTRAMUSCULAR | Status: AC | PRN
  Administered 2023-08-27: 100 mL via INTRAVENOUS

## 2023-08-27 MED ORDER — ONDANSETRON HCL 4 MG/2ML IJ SOLN
4.0000 mg | Freq: Once | INTRAMUSCULAR | Status: AC
  Administered 2023-08-27: 4 mg via INTRAVENOUS
  Filled 2023-08-27: qty 2

## 2023-08-27 MED ORDER — DROPERIDOL 2.5 MG/ML IJ SOLN
2.5000 mg | Freq: Once | INTRAMUSCULAR | Status: AC
  Administered 2023-08-27: 2.5 mg via INTRAVENOUS
  Filled 2023-08-27: qty 2

## 2023-08-27 MED ORDER — PANTOPRAZOLE SODIUM 40 MG IV SOLR
40.0000 mg | Freq: Once | INTRAVENOUS | Status: AC
  Administered 2023-08-27: 40 mg via INTRAVENOUS
  Filled 2023-08-27: qty 10

## 2023-08-27 NOTE — Discharge Instructions (Addendum)
Stop using marijuana.  Take the nausea medication as prescribed and follow-up with your primary doctor as well as GI doctor.  Start with a clear liquid diet and advance slowly as tolerated.  Return to the ED with worsening pain, vomiting, not able to eat or drink, persistent fever or any other concerns

## 2023-08-27 NOTE — ED Provider Notes (Signed)
Blood pressure 113/68, pulse 60, temperature 97.9 F (36.6 C), temperature source Oral, resp. rate 20, height 5\' 2"  (1.575 m), weight 47.2 kg, SpO2 97%.  Assuming care from Dr. Manus Gunning.  In short, Victoria Conway is a 22 y.o. female with a chief complaint of Emesis .  Refer to the original H&P for additional details.  The current plan of care is to follow on CT and reassess.  08:20 AM  CT imaging interpreted and agree with radiology interpretation.  Patient is feeling better on reassessment.  No additional vomiting in the ED.  She has Zofran at home. Discussed follow up with PCP and ED return precautions.    Maia Plan, MD 08/27/23 [phone]

## 2023-08-27 NOTE — Telephone Encounter (Signed)
Discharged from ED this morning around 8:20 am. Given Zofran.

## 2023-08-27 NOTE — ED Provider Notes (Signed)
[facility] Provider Note   CSN: [id number] Arrival date & time: 08/27/23  0345     History  Chief Complaint  Patient presents with   Emesis    Victoria Conway is a 22 y.o. female.  Patient with history of ADHD, migraine, liver lesion, follows with GI presenting with 24-hour history of nausea and vomiting and unable to tolerate p.o.  Finished treatment for C. difficile yesterday and was referred to the ED today by GI for concern for dehydration.  States she vomited about 8 times in the past 24 hours including some blood streaks and not able to keep anything down at home.  Only 1 loose stool in the past 24 hours.  No fever.  No pain with urination or blood in the urine.  Having periumbilical abdominal pain.  No pain with urination or blood in the urine.  No vaginal bleeding or discharge.  Did see some tinges of blood in her emesis yesterday but this has resolved.  Continues to use marijuana as she does chronically.  No previous abdominal surgeries.  No history of endoscopies. reports her diarrhea is improving and she has not had any for about 24 hours.  The history is provided by the patient.  Emesis Associated symptoms: abdominal pain and diarrhea   Associated symptoms: no arthralgias, no cough, no fever, no headaches and no myalgias        Home Medications Prior to Admission medications   Medication Sig Start Date End Date Taking? Authorizing Provider  amitriptyline (ELAVIL) 25 MG tablet Take 1 tablet (25 mg total) by mouth at bedtime. Patient not taking: Reported on 06/26/2022 05/21/19   Keturah Shavers, MD  Aspirin-Acetaminophen-Caffeine (PAMPRIN MAX PO) Take by mouth.    [provider]  buPROPion (WELLBUTRIN XL) 150 MG 24 hr tablet Take 150 mg by mouth every morning. 06/19/22   [provider]  dicyclomine (BENTYL) 10 MG capsule TAKE 1 CAPSULE(10 MG) BY MOUTH THREE TIMES DAILY AS NEEDED 08/09/23   Imogene Burn, MD   DULoxetine (CYMBALTA) 20 MG capsule Take 20 mg by mouth daily.    [provider]  ibuprofen (ADVIL) 800 MG tablet Take 800 mg by mouth as needed.    [provider]  norethindrone-ethinyl estradiol-FE (LOESTRIN FE) 1-20 MG-MCG tablet Take 1 tablet by mouth daily. 06/26/22   Romualdo Bolk, MD  ondansetron (ZOFRAN ODT) 4 MG disintegrating tablet Take 1 tablet (4 mg total) by mouth every 8 (eight) hours as needed for nausea or vomiting. 08/09/23   Imogene Burn, MD  ondansetron (ZOFRAN-ODT) 4 MG disintegrating tablet Take 1 tablet (4 mg total) by mouth every 8 (eight) hours as needed for nausea or vomiting. 01/15/23   Horton, Mayer Masker, MD  ondansetron (ZOFRAN-ODT) 4 MG disintegrating tablet Take 1 tablet (4 mg total) by mouth every 8 (eight) hours as needed for up to 15 doses. Patient not taking: Reported on 08/09/2023 04/10/23   Terald Sleeper, MD      Allergies    Patient has no known allergies.    Review of Systems   Review of Systems  Constitutional:  Positive for activity change and appetite change. Negative for fever.  HENT:  Negative for congestion and rhinorrhea.   Respiratory:  Negative for cough, chest tightness and shortness of breath.   Cardiovascular:  Negative for chest pain.  Gastrointestinal:  Positive for abdominal pain, diarrhea, nausea and vomiting.  Genitourinary:  Negative for dysuria and  hematuria.  Musculoskeletal:  Negative for arthralgias and myalgias.  Skin:  Negative for rash.  Neurological:  Negative for dizziness, weakness and headaches.   all other systems are negative except as noted in the HPI and PMH.    Physical Exam Updated Vital Signs BP 113/68 (BP Location: Right Arm)   Pulse 60   Temp 97.9 F (36.6 C) (Oral)   Resp 20   Ht 5\' 2"  (1.575 m)   Wt 47.2 kg   SpO2 97%   BMI 19.02 kg/m  Physical Exam Vitals and nursing note reviewed.  Constitutional:      General: She is not in acute distress.    Appearance: She is  well-developed.  HENT:     Head: Normocephalic and atraumatic.     Mouth/Throat:     Mouth: Mucous membranes are moist.     Pharynx: No oropharyngeal exudate.  Eyes:     Conjunctiva/sclera: Conjunctivae normal.     Pupils: Pupils are equal, round, and reactive to light.  Neck:     Comments: No meningismus. Cardiovascular:     Rate and Rhythm: Normal rate and regular rhythm.     Heart sounds: Normal heart sounds. No murmur heard. Pulmonary:     Effort: Pulmonary effort is normal. No respiratory distress.     Breath sounds: Normal breath sounds.  Abdominal:     Palpations: Abdomen is soft.     Tenderness: There is abdominal tenderness. There is no guarding or rebound.     Comments: Periumbilical abdominal tenderness, no RLQ tenderness  Musculoskeletal:        General: No tenderness. Normal range of motion.     Cervical back: Normal range of motion and neck supple.     Comments: No CVAT  Skin:    General: Skin is warm.  Neurological:     Mental Status: She is alert and oriented to person, place, and time.     Cranial Nerves: No cranial nerve deficit.     Motor: No abnormal muscle tone.     Coordination: Coordination normal.     Comments:  5/5 strength throughout. CN 2-12 intact.Equal grip strength.   Psychiatric:        Behavior: Behavior normal.     ED Results / Procedures / Treatments   Labs (all labs ordered are listed, but only abnormal results are displayed) Labs Reviewed  HEPATIC FUNCTION PANEL - Abnormal; Notable for the following components:      Result Value   Alkaline Phosphatase 34 (*)    Total Bilirubin 1.7 (*)    Indirect Bilirubin 1.5 (*)    All other components within normal limits  I-STAT CHEM 8, ED - Abnormal; Notable for the following components:   TCO2 19 (*)    All other components within normal limits  CBC WITH DIFFERENTIAL/PLATELET  LIPASE, BLOOD  HCG, SERUM, QUALITATIVE  URINALYSIS, ROUTINE W REFLEX MICROSCOPIC    EKG None  Radiology No  results found.  Procedures Procedures    Medications Ordered in ED Medications  sodium chloride 0.9 % bolus 1,000 mL (has no administration in time range)  ondansetron (ZOFRAN) injection 4 mg (has no administration in time range)  pantoprazole (PROTONIX) injection 40 mg (has no administration in time range)  droperidol (INAPSINE) 2.5 MG/ML injection 2.5 mg (has no administration in time range)    ED Course/ Medical Decision Making/ A&P  Medical Decision Making Amount and/or Complexity of Data Reviewed Labs: ordered. Decision-making details documented in ED Course. Radiology: ordered and independent interpretation performed. Decision-making details documented in ED Course. ECG/medicine tests: ordered and independent interpretation performed. Decision-making details documented in ED Course.  Risk Prescription drug management.   24 hours of nausea, vomiting, abdominal pain.  Recent treatment for C. difficile.  Abdomen soft without peritoneal signs.  Vital signs are stable  Patient given IV fluids, antiemetics. Labs are being obtained to assess for dehydration.  Will recheck LFTs given they were elevated previously.  Denies any significant abdominal pain currently but was having lots of cramping periumbilically at home.  hCG is negative.  Urinalysis is pending.  Labs show no leukocytosis.  LFTs and lipase are normal.  Dry heaving persists.  Will give droperidol and antiemetics.  Patient somnolent after medications.  Awaiting p.o. challenge.  Suspect likely cannabanoid hyperemesis syndrome causing her nausea and vomiting.  Will attempt p.o. challenge and obtain CT scan for further assessment of her abdominal pain. Additional IV fluids given. Advised to cease using marijuana  Care to be transferred at shift change to Dr. Jacqulyn Bath.           Final Clinical Impression(s) / ED Diagnoses Final diagnoses:  Nausea and vomiting, unspecified  vomiting type    Rx / DC Orders ED Discharge Orders     None         Perline Awe, Jeannett Senior, MD 08/27/23 [phone]

## 2023-08-27 NOTE — ED Notes (Signed)
Reviewed discharge instructions with pt. Pt states understanding. Ambulatory at discharge

## 2023-08-27 NOTE — ED Triage Notes (Signed)
Pt reports 24 hours of vomiting (6-7 times), and one loose stool yesterday. Subjective fever. Lower abd pain that she rates 3/10, and describes as intermittent and sharp. Denies abd tenderness. Recent tx for cdiff. She states she took her last pill yesterday. Last BM 1900 last night. She does report tinges of blood in the emesis but states this is not new as she has discussed it with her GI doctor.

## 2023-09-05 ENCOUNTER — Encounter: Payer: Self-pay | Admitting: Internal Medicine
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
## 2023-11-02 ENCOUNTER — Ambulatory Visit (HOSPITAL_COMMUNITY): Payer: 59

## 2024-05-27 ENCOUNTER — Ambulatory Visit
Admission: EM | Admit: 2024-05-27 | Discharge: 2024-05-27 | Disposition: A | Discharge status: Home / Self Care | Attending: Physician Assistant | Admitting: Physician Assistant

## 2024-05-27 ENCOUNTER — Ambulatory Visit: Payer: Self-pay

## 2024-05-27 ENCOUNTER — Other Ambulatory Visit: Payer: Self-pay

## 2024-05-27 ENCOUNTER — Other Ambulatory Visit (HOSPITAL_COMMUNITY): Payer: Self-pay

## 2024-05-27 DIAGNOSIS — Z3A09 9 weeks gestation of pregnancy: Secondary | ICD-10-CM

## 2024-05-27 DIAGNOSIS — O210 Mild hyperemesis gravidarum: Secondary | ICD-10-CM | POA: Diagnosis not present

## 2024-05-27 DIAGNOSIS — R112 Nausea with vomiting, unspecified: Secondary | ICD-10-CM

## 2024-05-27 MED ORDER — DOXYLAMINE-PYRIDOXINE 10-10 MG PO TBEC: 1.0000 | DELAYED_RELEASE_TABLET | Freq: Every day | ORAL | 0 refills | Status: AC

## 2024-05-27 NOTE — ED Provider Notes (Signed)
 [facility]    CSN: [id number] Arrival date & time: 05/27/24  1206      History   Chief Complaint Chief Complaint  Patient presents with   Emesis During Pregnancy   Nasal Congestion    HPI Victoria Conway is a 23 y.o. female.   HPI   Pt presents today with concerns for nausea and vomiting that started this AM she states she is unable to tolerate PO intake including liquids. She denies taking anything for symptoms PTA. She states she gets chills after vomiting and last night had sweats but not fever. She denies choking, coughing, or SOB. She is approx [redacted] weeks pregnant. She denies abdominal pain, vaginal bleeding or rashes     Past Medical History:  Diagnosis Date   ADHD    Migraine without aura     Patient Active Problem List   Diagnosis Date Noted   Migraine 06/10/2021   Migraine without aura and without status migrainosus, not intractable 05/03/2018   Abdominal migraine, not intractable 05/03/2018   Anxiety state 05/03/2018   Attention deficit hyperactivity disorder (ADHD), combined type 05/03/2018    Past Surgical History:  Procedure Laterality Date   MULTIPLE TOOTH EXTRACTIONS     WISDOM TOOTH EXTRACTION      OB History     Gravida  1   Para      Term      Preterm      AB  0   Living         SAB  0   IAB      Ectopic      Multiple      Live Births               Home Medications    Prior to Admission medications   Medication Sig Start Date End Date Taking? Authorizing Provider  Doxylamine -Pyridoxine  10-10 MG TBEC Take 1 tablet by mouth at bedtime. 05/27/24  Yes Divon Krabill E, PA-C  amitriptyline  (ELAVIL ) 25 MG tablet Take 1 tablet (25 mg total) by mouth at bedtime. Patient not taking: Reported on 06/26/2022 05/21/19   Corinthia Blossom, MD  Aspirin-Acetaminophen-Caffeine (PAMPRIN MAX PO) Take by mouth.    [provider]  buPROPion (WELLBUTRIN XL) 150 MG 24 hr tablet Take 150 mg by mouth every morning. 06/19/22    [provider]  dicyclomine  (BENTYL ) 10 MG capsule TAKE 1 CAPSULE(10 MG) BY MOUTH THREE TIMES DAILY AS NEEDED 08/09/23   Dorsey, Ying C, MD  DULoxetine (CYMBALTA) 20 MG capsule Take 20 mg by mouth daily.    [provider]  ibuprofen (ADVIL) 800 MG tablet Take 800 mg by mouth as needed.    [provider]  norethindrone-ethinyl estradiol-FE (LOESTRIN FE) 1-20 MG-MCG tablet Take 1 tablet by mouth daily. 06/26/22   Jertson, Jill Evelyn, MD  ondansetron  (ZOFRAN  ODT) 4 MG disintegrating tablet Take 1 tablet (4 mg total) by mouth every 8 (eight) hours as needed for nausea or vomiting. 08/09/23   Federico Rosario BROCKS, MD  ondansetron  (ZOFRAN -ODT) 4 MG disintegrating tablet Take 1 tablet (4 mg total) by mouth every 8 (eight) hours as needed for nausea or vomiting. 01/15/23   Horton, Charmaine FALCON, MD  ondansetron  (ZOFRAN -ODT) 4 MG disintegrating tablet Take 1 tablet (4 mg total) by mouth every 8 (eight) hours as needed for up to 15 doses. Patient not taking: Reported on 08/09/2023 04/10/23   Cottie Donnice PARAS, MD    Family History Family History  Adopted:  Yes  Problem Relation Age of Onset   Breast cancer Maternal Grandmother    Thyroid disease Paternal Grandmother    Uterine cancer Paternal Grandmother    Breast cancer Maternal Aunt    Migraines Neg Hx    Seizures Neg Hx    Colon cancer Neg Hx    Stomach cancer Neg Hx    Esophageal cancer Neg Hx     Social History Social History   Tobacco Use   Smoking status: Former    Types: E-cigarettes   Smokeless tobacco: Never  Vaping Use   Vaping status: Some Days   Substances: Flavoring  Substance Use Topics   Alcohol use: Not Currently    Comment: Seldom   Drug use: Yes    Frequency: 7.0 times per week    Types: Marijuana     Allergies   Patient has no known allergies.   Review of Systems Review of Systems  Constitutional:  Positive for chills and diaphoresis. Negative for fever.  Respiratory:  Positive for  choking. Negative for cough and shortness of breath.   Gastrointestinal:  Positive for nausea and vomiting. Negative for abdominal pain.  Skin:  Negative for rash.     Physical Exam Triage Vital Signs ED Triage Vitals  Encounter Vitals Group     BP 05/27/24 1303 113/68     Girls Systolic BP Percentile --      Girls Diastolic BP Percentile --      Boys Systolic BP Percentile --      Boys Diastolic BP Percentile --      Pulse Rate 05/27/24 1303 79     Resp 05/27/24 1303 17     Temp 05/27/24 1303 97.8 F (36.6 C)     Temp Source 05/27/24 1303 Oral     SpO2 05/27/24 1303 98 %     Weight 05/27/24 1306 101 lb (45.8 kg)     Height 05/27/24 1306 5' 2 (1.575 m)     Head Circumference --      Peak Flow --      Pain Score 05/27/24 1305 4     Pain Loc --      Pain Education --      Exclude from Growth Chart --    No data found.  Updated Vital Signs BP 113/68 (BP Location: Right Arm)   Pulse 79   Temp 97.8 F (36.6 C) (Oral)   Resp 17   Ht 5' 2 (1.575 m)   Wt 101 lb (45.8 kg)   LMP 02/29/2024 (Approximate)   SpO2 98%   BMI 18.47 kg/m   Visual Acuity Right Eye Distance:   Left Eye Distance:   Bilateral Distance:    Right Eye Near:   Left Eye Near:    Bilateral Near:     Physical Exam Vitals reviewed.  Constitutional:      General: She is awake.     Appearance: Normal appearance. She is well-developed and well-groomed.  HENT:     Head: Normocephalic and atraumatic.     Mouth/Throat:     Lips: Pink.     Mouth: Mucous membranes are moist. No lacerations or oral lesions.     Pharynx: Oropharynx is clear. Uvula midline. No pharyngeal swelling, oropharyngeal exudate, posterior oropharyngeal erythema, uvula swelling or postnasal drip.  Eyes:     General: Lids are normal. Gaze aligned appropriately.     Extraocular Movements: Extraocular movements intact.     Conjunctiva/sclera: Conjunctivae normal.  Cardiovascular:  Rate and Rhythm: Normal rate and regular  rhythm.     Heart sounds: Normal heart sounds. No murmur heard.    No friction rub. No gallop.  Pulmonary:     Effort: Pulmonary effort is normal.     Breath sounds: Normal breath sounds. No decreased air movement. No decreased breath sounds, wheezing, rhonchi or rales.  Neurological:     Mental Status: She is alert and oriented to person, place, and time.  Psychiatric:        Attention and Perception: Attention and perception normal.        Mood and Affect: Mood and affect normal.        Speech: Speech normal.        Behavior: Behavior normal. Behavior is cooperative.      UC Treatments / Results  Labs (all labs ordered are listed, but only abnormal results are displayed) Labs Reviewed - No data to display  EKG   Radiology No results found.  Procedures Procedures (including critical care time)  Medications Ordered in UC Medications - No data to display  Initial Impression / Assessment and Plan / UC Course  I have reviewed the triage vital signs and the nursing notes.  Pertinent labs & imaging results that were available during my care of the patient were reviewed by me and considered in my medical decision making (see chart for details).      Final Clinical Impressions(s) / UC Diagnoses   Final diagnoses:  Nausea and vomiting, unspecified vomiting type  Morning sickness   Patient presents today with concerns for nausea and vomiting.  She states that this has been ongoing for some time but this morning she has been unable to tolerate any p.o. intake including liquids.  Patient is approximately [redacted] weeks pregnant and denies abdominal pain, vaginal bleeding or rashes, fevers or chills, chest pain.  At this time I suspect symptoms are likely due to pregnancy and associated morning sickness.  Will start patient on Diclegis .  Reviewed importance of regular follow-up with OB/GYN for ongoing pregnancy monitoring and management.  Will provide patient with pregnancy-safe OTC  medications to assist with symptomatic management.  Reviewed signs and symptoms suggestive of more severe nausea and vomiting and dehydration that would require ED evaluation.  Follow-up as needed for progressing or persistent symptoms  Discharge Instructions   None    ED Prescriptions     Medication Sig Dispense Auth. Provider   Doxylamine -Pyridoxine  10-10 MG TBEC Take 1 tablet by mouth at bedtime. 60 tablet Thaila Bottoms E, PA-C      PDMP not reviewed this encounter.   Mittie Knittel E, PA-C 05/27/24 2158

## 2024-05-27 NOTE — Telephone Encounter (Signed)
 Copied from CRM #[id number]. Topic: Clinical - Pink Word Triage >> May 27, 2024  9:39 AM Robinson H wrote: Reason for Triage: Pregnant, vomiting more than usual, congestion >> May 27, 2024  9:39 AM Robinson H wrote: Pregnant, vomiting more than usual, congestionThis encounter was created in error - please disregard.   Declines triage, states going to the ER with her two sisters.

## 2024-05-27 NOTE — ED Triage Notes (Signed)
 Pt presents with a chief complaint of vomiting. States it started this morning at approximately 7 AM. Unable to hold any fluids or solid foods down. Approximately [redacted] weeks pregnant. Currently experiencing right-sided back pain, rates as a 4/10. Denies urinary symptoms. Also mentions nasal congestion for three days. Denies fevers.

## 2024-05-27 NOTE — Telephone Encounter (Signed)
 Pt states that she is calling to schedule new pt appts for her sisters. Pt states that her PCP is not with [facility]. Pt states that she was told this morning to go to UC. Pt states that she has been vomiting more than normal but states that she does not want to go to more than one place today. Pt states she is going to go to UC. Will call later to schedule her sisters with [facility] for PCP.      Summary: Vomiting   Pregnant, vomiting more than usual, congestion

## 2024-07-02 ENCOUNTER — Other Ambulatory Visit: Payer: Self-pay

## 2024-07-02 ENCOUNTER — Emergency Department (HOSPITAL_BASED_OUTPATIENT_CLINIC_OR_DEPARTMENT_OTHER): Admission: EM | Admit: 2024-07-02 | Discharge: 2024-07-02 | Disposition: A | Discharge status: Home / Self Care

## 2024-07-02 DIAGNOSIS — O99352 Diseases of the nervous system complicating pregnancy, second trimester: Secondary | ICD-10-CM | POA: Insufficient documentation

## 2024-07-02 DIAGNOSIS — G43809 Other migraine, not intractable, without status migrainosus: Secondary | ICD-10-CM | POA: Diagnosis not present

## 2024-07-02 DIAGNOSIS — Z3A14 14 weeks gestation of pregnancy: Secondary | ICD-10-CM | POA: Insufficient documentation

## 2024-07-02 DIAGNOSIS — Z7982 Long term (current) use of aspirin: Secondary | ICD-10-CM | POA: Diagnosis not present

## 2024-07-02 DIAGNOSIS — O26892 Other specified pregnancy related conditions, second trimester: Secondary | ICD-10-CM | POA: Diagnosis present

## 2024-07-02 DIAGNOSIS — R112 Nausea with vomiting, unspecified: Secondary | ICD-10-CM

## 2024-07-02 LAB — CBC WITH DIFFERENTIAL/PLATELET
Abs Immature Granulocytes: 0.11 K/uL — ABNORMAL HIGH (ref 0.00–0.07)
Basophils Absolute: 0 K/uL (ref 0.0–0.1)
Basophils Relative: 0 %
Eosinophils Absolute: 0 K/uL (ref 0.0–0.5)
Eosinophils Relative: 0 %
HCT: 39.2 % (ref 36.0–46.0)
Hemoglobin: 14 g/dL (ref 12.0–15.0)
Immature Granulocytes: 1 %
Lymphocytes Relative: 14 %
Lymphs Abs: 1.7 K/uL (ref 0.7–4.0)
MCH: 29 pg (ref 26.0–34.0)
MCHC: 35.7 g/dL (ref 30.0–36.0)
MCV: 81.3 fL (ref 80.0–100.0)
Monocytes Absolute: 0.6 K/uL (ref 0.1–1.0)
Monocytes Relative: 5 %
Neutro Abs: 10.2 K/uL — ABNORMAL HIGH (ref 1.7–7.7)
Neutrophils Relative %: 80 %
Platelets: 208 K/uL (ref 150–400)
RBC: 4.82 MIL/uL (ref 3.87–5.11)
RDW: 13.5 % (ref 11.5–15.5)
WBC: 12.7 K/uL — ABNORMAL HIGH (ref 4.0–10.5)
nRBC: 0 % (ref 0.0–0.2)

## 2024-07-02 LAB — URINALYSIS, ROUTINE W REFLEX MICROSCOPIC
Bilirubin Urine: NEGATIVE
Glucose, UA: NEGATIVE mg/dL
Hgb urine dipstick: NEGATIVE
Ketones, ur: >=80 mg/dL — AB
Nitrite: NEGATIVE
Protein, ur: NEGATIVE mg/dL
Specific Gravity, Urine: >=1.03 (ref 1.005–1.030)
pH: 7 (ref 5.0–8.0)

## 2024-07-02 LAB — BASIC METABOLIC PANEL WITH GFR
Anion gap: 15 (ref 5–15)
BUN: 10 mg/dL (ref 6–20)
CO2: 21 mmol/L — ABNORMAL LOW (ref 22–32)
Calcium: 9.2 mg/dL (ref 8.9–10.3)
Chloride: 99 mmol/L (ref 98–111)
Creatinine, Ser: 0.52 mg/dL (ref 0.44–1.00)
GFR, Estimated: >60 mL/min (ref >60)
Glucose, Bld: 78 mg/dL (ref 70–99)
Potassium: 4 mmol/L (ref 3.5–5.1)
Sodium: 134 mmol/L — ABNORMAL LOW (ref 135–145)

## 2024-07-02 LAB — URINALYSIS, MICROSCOPIC (REFLEX)

## 2024-07-02 MED ORDER — METOCLOPRAMIDE HCL 5 MG/ML IJ SOLN
5.0000 mg | Freq: Once | INTRAMUSCULAR | Status: AC
  Administered 2024-07-02: 5 mg via INTRAVENOUS
  Filled 2024-07-02: qty 2

## 2024-07-02 MED ORDER — SODIUM CHLORIDE 0.9 % IV BOLUS
1000.0000 mL | Freq: Once | INTRAVENOUS | Status: AC
  Administered 2024-07-02: 1000 mL via INTRAVENOUS

## 2024-07-02 MED ORDER — DIPHENHYDRAMINE HCL 50 MG/ML IJ SOLN
12.5000 mg | Freq: Once | INTRAMUSCULAR | Status: AC
  Administered 2024-07-02: 12.5 mg via INTRAVENOUS
  Filled 2024-07-02: qty 1

## 2024-07-02 NOTE — ED Notes (Signed)
 Fetal HR 145bpm. PA notified

## 2024-07-02 NOTE — ED Provider Notes (Signed)
 [facility] Provider Note   CSN: [id number] Arrival date & time: 07/02/24  1149     Patient presents with: Emesis During Pregnancy   Victoria Conway is a 23 y.o. female.   Patient to ED with complaint of nausea and vomiting that has been persistent through her pregnancy. She reports she is [redacted] weeks pregnant, is receiving regular prenatal care. She reports a history of migraine headaches and has had a right sided headache typical of her migraine symptoms for the past 2 days. She was seen for hyperemesis of pregnancy at Urgent Care last month and prescribed Diclegis , but was unable to afford this medication. She has Zofran  at home but reports she is afraid to take this because she feels there is a pregnancy risk. No fever. No hematemesis. She denies abdominal pain, vaginal bleeding or vaginal discharge.   The history is provided by the patient. No language interpreter was used.       Prior to Admission medications   Medication Sig Start Date End Date Taking? Authorizing Provider  amitriptyline  (ELAVIL ) 25 MG tablet Take 1 tablet (25 mg total) by mouth at bedtime. Patient not taking: Reported on 06/26/2022 05/21/19   Corinthia Blossom, MD  Aspirin-Acetaminophen-Caffeine (PAMPRIN MAX PO) Take by mouth.    [provider]  buPROPion (WELLBUTRIN XL) 150 MG 24 hr tablet Take 150 mg by mouth every morning. 06/19/22   [provider]  dicyclomine  (BENTYL ) 10 MG capsule TAKE 1 CAPSULE(10 MG) BY MOUTH THREE TIMES DAILY AS NEEDED 08/09/23   Federico Rosario BROCKS, MD  Doxylamine -Pyridoxine  10-10 MG TBEC Take 1 tablet by mouth at bedtime. 05/27/24   Mecum, Erin E, PA-C  DULoxetine (CYMBALTA) 20 MG capsule Take 20 mg by mouth daily.    [provider]  ibuprofen (ADVIL) 800 MG tablet Take 800 mg by mouth as needed.    [provider]  norethindrone-ethinyl estradiol-FE (LOESTRIN FE) 1-20 MG-MCG tablet Take 1 tablet by mouth daily.  06/26/22   Jertson, Jill Evelyn, MD  ondansetron  (ZOFRAN  ODT) 4 MG disintegrating tablet Take 1 tablet (4 mg total) by mouth every 8 (eight) hours as needed for nausea or vomiting. 08/09/23   Federico Rosario BROCKS, MD  ondansetron  (ZOFRAN -ODT) 4 MG disintegrating tablet Take 1 tablet (4 mg total) by mouth every 8 (eight) hours as needed for nausea or vomiting. 01/15/23   Horton, Charmaine FALCON, MD  ondansetron  (ZOFRAN -ODT) 4 MG disintegrating tablet Take 1 tablet (4 mg total) by mouth every 8 (eight) hours as needed for up to 15 doses. Patient not taking: Reported on 08/09/2023 04/10/23   Cottie Donnice PARAS, MD    Allergies: Patient has no known allergies.    Review of Systems  Updated Vital Signs BP 108/72   Pulse 74   Temp 98.4 F (36.9 C)   Resp 17   Ht 5' 2 (1.575 m)   Wt 46.7 kg   LMP 02/29/2024 (Approximate)   SpO2 100%   BMI 18.84 kg/m   Physical Exam Vitals and nursing note reviewed.  Constitutional:      General: She is not in acute distress.    Appearance: Normal appearance.  HENT:     Head: Normocephalic.     Mouth/Throat:     Mouth: Mucous membranes are dry.  Eyes:     Extraocular Movements: Extraocular movements intact.     Pupils: Pupils are equal, round, and reactive to light.  Cardiovascular:     Rate and  Rhythm: Normal rate.  Pulmonary:     Effort: Pulmonary effort is normal.  Abdominal:     General: Abdomen is flat. There is no distension.     Palpations: Abdomen is soft.     Tenderness: There is no abdominal tenderness.  Musculoskeletal:        General: Normal range of motion.     Cervical back: Normal range of motion and neck supple.  Skin:    General: Skin is warm and dry.  Neurological:     General: No focal deficit present.     Mental Status: She is alert and oriented to person, place, and time.     Cranial Nerves: No cranial nerve deficit.     Coordination: Coordination normal.     Gait: Gait normal.     (all labs ordered are listed, but only  abnormal results are displayed) Labs Reviewed  URINALYSIS, ROUTINE W REFLEX MICROSCOPIC - Abnormal; Notable for the following components:      Result Value   APPearance HAZY (*)    Ketones, ur >=80 (*)    Leukocytes,Ua SMALL (*)    All other components within normal limits  BASIC METABOLIC PANEL WITH GFR - Abnormal; Notable for the following components:   Sodium 134 (*)    CO2 21 (*)    All other components within normal limits  CBC WITH DIFFERENTIAL/PLATELET - Abnormal; Notable for the following components:   WBC 12.7 (*)    Neutro Abs 10.2 (*)    Abs Immature Granulocytes 0.11 (*)    All other components within normal limits  URINALYSIS, MICROSCOPIC (REFLEX) - Abnormal; Notable for the following components:   Bacteria, UA MANY (*)    All other components within normal limits    EKG: None  Radiology: No results found.   Procedures   Medications Ordered in the ED  sodium chloride  0.9 % bolus 1,000 mL (1,000 mLs Intravenous New Bag/Given 07/02/24 1452)  metoCLOPramide (REGLAN) injection 5 mg (5 mg Intravenous Given 07/02/24 1454)  diphenhydrAMINE  (BENADRYL ) injection 12.5 mg (12.5 mg Intravenous Given 07/02/24 1452)    Clinical Course as of 07/02/24 1634  Wed Jul 02, 2024  1449 Patient [redacted] weeks pregnant, under OB care, here with persistent/uncontrolled nausea and vomiting, now with headache typical of her migraines. No fever. Exam reassuring.   Will provide IV fluids, Reglan and Benadryl  for nausea and as headache cocktail. Will reassess.  [SU]  1554 Recheck - patient is feeling better with medications, headache is resolved. IV fluids being infused. Will allow fluids to finish. PO challenge with ginger ale provided.  [SU]  1632 Fetal heart tones 140. Headache is resolved. VSS. She is stable for discharge home. Discussed use of Zofran  and prescribed by her OB to control nausea and importance of staying hydrated.  [SU]    Clinical Course User Index [SU] Odell Balls, PA-C                                  Medical Decision Making Amount and/or Complexity of Data Reviewed Labs: ordered.  Risk Prescription drug management.        Final diagnoses:  Other migraine without status migrainosus, not intractable  Nausea and vomiting, unspecified vomiting type    ED Discharge Orders     None          Odell Balls, PA-C 07/02/24 1634    Simon Lavonia SAILOR, MD 07/03/24 [phone]

## 2024-07-02 NOTE — ED Triage Notes (Signed)
 Pt c/o NV and unable to keep anything down since last night; also has a HA; sts OB referred her here for a  headache cocktail; 14 wk 5 d pregnant

## 2024-07-02 NOTE — Discharge Instructions (Signed)
 As we discussed, follow up with your doctor (OB) for recheck of current symptoms. Use Zofran  at home as prescribed by your OB for nausea. Push fluids and advance diet as tolerated.   Return to the ED with any or concerning symptoms at any time.

## 2024-10-09 ENCOUNTER — Other Ambulatory Visit: Payer: Self-pay

## 2024-10-09 ENCOUNTER — Encounter (HOSPITAL_BASED_OUTPATIENT_CLINIC_OR_DEPARTMENT_OTHER): Payer: Self-pay

## 2024-10-09 ENCOUNTER — Emergency Department (HOSPITAL_BASED_OUTPATIENT_CLINIC_OR_DEPARTMENT_OTHER)
Admission: EM | Admit: 2024-10-09 | Discharge: 2024-10-09 | Disposition: A | Discharge status: Home / Self Care | Attending: Emergency Medicine | Admitting: Emergency Medicine

## 2024-10-09 DIAGNOSIS — Z7982 Long term (current) use of aspirin: Secondary | ICD-10-CM | POA: Insufficient documentation

## 2024-10-09 DIAGNOSIS — R059 Cough, unspecified: Secondary | ICD-10-CM | POA: Diagnosis present

## 2024-10-09 DIAGNOSIS — R6889 Other general symptoms and signs: Secondary | ICD-10-CM

## 2024-10-09 LAB — RESP PANEL BY RT-PCR (RSV, FLU A&B, COVID)  RVPGX2
Influenza A by PCR: NEGATIVE
Influenza B by PCR: NEGATIVE
Resp Syncytial Virus by PCR: NEGATIVE
SARS Coronavirus 2 by RT PCR: NEGATIVE

## 2024-10-09 NOTE — Discharge Instructions (Signed)
 You tested negative for flu COVID and RSV today in the emergency department.  I recommend you following back up with your OB/GYN for further recommendations on symptom management.  Please monitor for any worsening symptoms including high fevers, shortness of breath, chest pain, dizziness, or fainting.  If any of these or other concerning symptoms arise please return to the emergency department for further evaluation.

## 2024-10-09 NOTE — ED Notes (Addendum)
 Called [facility] to make them aware of pt having flu symptoms. Only came to have a flu test done.  Spoke with [facility] and she advised that pt only needs heart tones. Those were completed in Triage.

## 2024-10-09 NOTE — ED Triage Notes (Signed)
 Pt is [redacted] weeks pregnant. Seen her OB doctor this morning and they were concerned that she may have the flu. The told her to come here to be checked for the flu. Pt states that she has body aches, hot flashes, headache.

## 2024-10-09 NOTE — ED Provider Notes (Signed)
 " [facility] Provider Note   CSN: [id number] Arrival date & time: 10/09/24  1246     Patient presents with: flu like symptoms   Victoria Conway is a 24 y.o. female.  24 year old female presents emergency department with complaints of flulike symptoms.  Patient reports she is 7 months pregnant and went to her OB/GYN for same.  They evaluated baby with Doppler and advised everything checked out and advised her to go to the emergency department to get a flu test because they could not test for flu in office.  Patient endorses body aches and headache x 3 days.  Patient reports worst of her symptoms were yesterday and they are not getting better today.  She has not taken anything for the symptoms.  She has had a intermittent cough as well nonproductive.  Patient currently reports active movement from baby.  Patient denies any shortness of breath, fevers, chest pain, nausea, vomiting, dizziness, syncope, vaginal bleeding or pelvic cramping.     Prior to Admission medications  Medication Sig Start Date End Date Taking? Authorizing Provider  amitriptyline  (ELAVIL ) 25 MG tablet Take 1 tablet (25 mg total) by mouth at bedtime. Patient not taking: Reported on 06/26/2022 05/21/19   Corinthia Blossom, MD  Aspirin-Acetaminophen-Caffeine (PAMPRIN MAX PO) Take by mouth.    [provider]  buPROPion (WELLBUTRIN XL) 150 MG 24 hr tablet Take 150 mg by mouth every morning. 06/19/22   [provider]  dicyclomine  (BENTYL ) 10 MG capsule TAKE 1 CAPSULE(10 MG) BY MOUTH THREE TIMES DAILY AS NEEDED 08/09/23   Federico Rosario BROCKS, MD  Doxylamine -Pyridoxine  10-10 MG TBEC Take 1 tablet by mouth at bedtime. 05/27/24   Mecum, Erin E, PA-C  DULoxetine (CYMBALTA) 20 MG capsule Take 20 mg by mouth daily.    [provider]  ibuprofen (ADVIL) 800 MG tablet Take 800 mg by mouth as needed.    [provider]  norethindrone-ethinyl estradiol-FE (LOESTRIN FE)  1-20 MG-MCG tablet Take 1 tablet by mouth daily. 06/26/22   Jertson, Jill Evelyn, MD  ondansetron  (ZOFRAN  ODT) 4 MG disintegrating tablet Take 1 tablet (4 mg total) by mouth every 8 (eight) hours as needed for nausea or vomiting. 08/09/23   Federico Rosario BROCKS, MD  ondansetron  (ZOFRAN -ODT) 4 MG disintegrating tablet Take 1 tablet (4 mg total) by mouth every 8 (eight) hours as needed for nausea or vomiting. 01/15/23   Horton, Charmaine FALCON, MD  ondansetron  (ZOFRAN -ODT) 4 MG disintegrating tablet Take 1 tablet (4 mg total) by mouth every 8 (eight) hours as needed for up to 15 doses. Patient not taking: Reported on 08/09/2023 04/10/23   Cottie Donnice PARAS, MD    Allergies: Patient has no known allergies.    Review of Systems  Respiratory:  Positive for cough.   All other systems reviewed and are negative.   Updated Vital Signs BP 127/74 (BP Location: Right Arm)   Pulse 97   Temp 98 F (36.7 C) (Oral)   Resp 16   Ht 5' 2 (1.575 m)   Wt 57.2 kg   LMP 02/29/2024 (Approximate)   SpO2 98%   BMI 23.05 kg/m   Physical Exam Vitals and nursing note reviewed.  Constitutional:      General: She is not in acute distress.    Appearance: Normal appearance. She is not ill-appearing or toxic-appearing.  HENT:     Head: Normocephalic and atraumatic.     Nose: Nose normal.  Eyes:  Extraocular Movements: Extraocular movements intact.     Conjunctiva/sclera: Conjunctivae normal.     Pupils: Pupils are equal, round, and reactive to light.  Cardiovascular:     Rate and Rhythm: Normal rate.  Pulmonary:     Effort: Pulmonary effort is normal. No respiratory distress.     Breath sounds: Normal breath sounds.     Comments: Lungs are clear to auscultation in all fields Abdominal:     General: Abdomen is flat. There is no distension.     Tenderness: There is no abdominal tenderness. There is no guarding.  Musculoskeletal:        General: Normal range of motion.     Cervical back: Normal range of motion.   Skin:    General: Skin is warm.     Capillary Refill: Capillary refill takes less than 2 seconds.  Neurological:     General: No focal deficit present.     Mental Status: She is alert.  Psychiatric:        Mood and Affect: Mood normal.        Behavior: Behavior normal.     (all labs ordered are listed, but only abnormal results are displayed) Labs Reviewed  RESP PANEL BY RT-PCR (RSV, FLU A&B, COVID)  [id number]    EKG: None  Radiology: No results found.   Procedures   Medications Ordered in the ED - No data to display  24 y.o. female presents to the ED with complaints of bodyaches and headaches x 3 days,  The differential diagnosis includes viral illness, (Ddx)  On arrival pt is nontoxic, vitals unremarkable. Exam unremarkable  Lab Tests:  Respiratory panel ordered in triage which was negative  ED Course:   Patient is sitting comfortably in ED bed in no acute distress nontoxic-appearing.  Patient is requesting flu test which was recommendations from her OB/GYN.  She is reporting improvement of symptoms from initial onset.  I suspect this is a viral illness.  No current fever and lungs are clear to auscultation in all fields and vitals are stable.  Patient reports active movement from baby and baby was checked at OB/GYN prior to arrival.  Doppler in the ED was 145 bpm.  Respiratory panel was negative for flu COVID RSV.  I suspect this is secondary to another viral illness and patient is already starting to feel better from initial onset.  It was recommended for patient to follow back up with OB/GYN with results from the ED and further recommendations of symptom management.  Patient was given strict return precaution and was comfortable discharge at this time.   Portions of this note were generated with Scientist, clinical (histocompatibility and immunogenetics). Dictation errors may occur despite best attempts at proofreading.   Final diagnoses:  Flu-like symptoms    ED Discharge Orders     None           Myriam Fonda RAMAN, [place] 10/09/24 1445  "
